# Patient Record
Sex: Male | Born: 1955 | Race: White | Hispanic: No | Marital: Married | Smoking: Former smoker
Health system: Southern US, Community
[De-identification: ages and names within clinical notes are randomized; demographics above are authoritative.]

## PROBLEM LIST (undated history)

## (undated) DIAGNOSIS — J449 Chronic obstructive pulmonary disease, unspecified: Secondary | ICD-10-CM

## (undated) HISTORY — PX: COLON SURGERY: SHX602

---

## 2016-12-26 ENCOUNTER — Emergency Department (HOSPITAL_COMMUNITY): Payer: Self-pay

## 2016-12-26 ENCOUNTER — Encounter (HOSPITAL_COMMUNITY): Payer: Self-pay | Admitting: Emergency Medicine

## 2016-12-26 ENCOUNTER — Emergency Department (HOSPITAL_COMMUNITY)
Admission: EM | Admit: 2016-12-26 | Discharge: 2016-12-26 | Disposition: A | Discharge status: Home / Self Care | Payer: Self-pay | Attending: Emergency Medicine | Admitting: Emergency Medicine

## 2016-12-26 DIAGNOSIS — R0602 Shortness of breath: Secondary | ICD-10-CM | POA: Insufficient documentation

## 2016-12-26 DIAGNOSIS — R52 Pain, unspecified: Secondary | ICD-10-CM

## 2016-12-26 DIAGNOSIS — J449 Chronic obstructive pulmonary disease, unspecified: Secondary | ICD-10-CM | POA: Insufficient documentation

## 2016-12-26 DIAGNOSIS — R1011 Right upper quadrant pain: Secondary | ICD-10-CM | POA: Insufficient documentation

## 2016-12-26 HISTORY — DX: Chronic obstructive pulmonary disease, unspecified: J44.9

## 2016-12-26 LAB — CBC
HCT: 52.2 % — ABNORMAL HIGH (ref 39.0–52.0)
HEMOGLOBIN: 18.4 g/dL — AB (ref 13.0–17.0)
MCH: 31.2 pg (ref 26.0–34.0)
MCHC: 35.2 g/dL (ref 30.0–36.0)
MCV: 88.5 fL (ref 78.0–100.0)
Platelets: 204 10*3/uL (ref 150–400)
RBC: 5.9 MIL/uL — AB (ref 4.22–5.81)
RDW: 12.4 % (ref 11.5–15.5)
WBC: 15 10*3/uL — ABNORMAL HIGH (ref 4.0–10.5)

## 2016-12-26 LAB — COMPREHENSIVE METABOLIC PANEL
ALK PHOS: 52 U/L (ref 38–126)
ALT: 28 U/L (ref 17–63)
ANION GAP: 15 (ref 5–15)
AST: 50 U/L — ABNORMAL HIGH (ref 15–41)
Albumin: 3.9 g/dL (ref 3.5–5.0)
BUN: 29 mg/dL — ABNORMAL HIGH (ref 6–20)
CHLORIDE: 95 mmol/L — AB (ref 101–111)
CO2: 27 mmol/L (ref 22–32)
CREATININE: 1.36 mg/dL — AB (ref 0.61–1.24)
Calcium: 9.3 mg/dL (ref 8.9–10.3)
GFR calc Af Amer: >60 mL/min (ref >60)
GFR calc non Af Amer: 55 mL/min — ABNORMAL LOW (ref >60)
GLUCOSE: 138 mg/dL — AB (ref 65–99)
Potassium: 3.6 mmol/L (ref 3.5–5.1)
Sodium: 137 mmol/L (ref 135–145)
Total Bilirubin: 0.3 mg/dL (ref 0.3–1.2)
Total Protein: 8.2 g/dL — ABNORMAL HIGH (ref 6.5–8.1)

## 2016-12-26 LAB — I-STAT TROPONIN, ED: Troponin i, poc: 0.01 ng/mL (ref 0.00–0.08)

## 2016-12-26 LAB — LIPASE, BLOOD: Lipase: 14 U/L (ref 11–51)

## 2016-12-26 MED ORDER — DOXYCYCLINE HYCLATE 100 MG PO CAPS: 100.0000 mg | ORAL_CAPSULE | Freq: Two times a day (BID) | ORAL | 0 refills | Status: DC

## 2016-12-26 MED ORDER — DOXYCYCLINE HYCLATE 100 MG IV SOLR
100.0000 mg | Freq: Once | INTRAVENOUS | Status: AC
  Administered 2016-12-26: 100 mg via INTRAVENOUS
  Filled 2016-12-26: qty 100

## 2016-12-26 MED ORDER — ONDANSETRON 4 MG PO TBDP
4.0000 mg | ORAL_TABLET | Freq: Once | ORAL | Status: AC
  Administered 2016-12-26: 4 mg via ORAL
  Filled 2016-12-26: qty 1

## 2016-12-26 MED ORDER — SODIUM CHLORIDE 0.9 % IV BOLUS (SEPSIS)
1000.0000 mL | Freq: Once | INTRAVENOUS | Status: AC
  Administered 2016-12-26: 1000 mL via INTRAVENOUS

## 2016-12-26 MED ORDER — ONDANSETRON 4 MG PO TBDP: 4.0000 mg | ORAL_TABLET | Freq: Three times a day (TID) | ORAL | 0 refills | Status: AC | PRN

## 2016-12-26 NOTE — ED Notes (Signed)
Pt verbalized understanding of d/c instructions and has no further questions. Pt is stable, A&Ox4, VSS.  

## 2016-12-26 NOTE — ED Notes (Signed)
Patient refused to wear a gown. He stated that he was too hot  CO of nausea, non witnessed No temp...98.2 when taken in room

## 2016-12-26 NOTE — ED Provider Notes (Signed)
[facility]EM[facility]ENCY DEPT Provider Note   CSN: [id number] Arrival date & time: 12/26/16  1449     History   Chief Complaint Chief Complaint  Patient presents with  . Generalized Body Aches  . Chills    HPI Raymond Robles is a 61 y.o. male with PMHx COPD who presents today with chief complaint 6 day history of "feeling sick" he states he mowed the lawn last Wednesday and noticed tick on his left shoulder on Thursday which his wife removed with a pair of tweezers. Wife states she was able to remove the entire tick with no issues, including the head. Since then pt states he has had a throbbing frontal HA that does not radiate, cough with intermittent white sputum production and SOB, generalized body aches and joint pains. He has also had watery non-bloody diarrhea since then with no formed stools. States abdominal pain is generalized crampy 7/10 pain which does not radiate. Denies chest pain. His wife states he was running a fever of 102F 4 days ago which has not returned and pt endorses feeling "hot and cold off and on". He denies rash. He has taken advil for his symptoms with no relief. Denies dizziness, LOC, dysuria, hematuria, melena, hematochezia. Of note, he states he had two tick bites on his abdomen 1 year ago which he removed and subsequently developed similar symptoms but did not seek medical attention at that time and his symptoms resolved. He has not had similar symptoms since. He is a 1/2ppd smoker for x 40 years.    The history is provided by the patient.    Past Medical History:  Diagnosis Date  . COPD (chronic obstructive pulmonary disease) (HCC)     There are no active problems to display for this patient.   No past surgical history on file.     Home Medications    Prior to Admission medications   Medication Sig Start Date End Date Taking? Authorizing Provider  doxycycline (VIBRAMYCIN) 100 MG capsule Take 1 capsule (100 mg total) by mouth 2 (two) times daily. 12/26/16  01/05/17  Faviola Klare A Erline Siddoway, PA-C  ondansetron (ZOFRAN ODT) 4 MG disintegrating tablet Take 1 tablet (4 mg total) by mouth every 8 (eight) hours as needed for nausea or vomiting. 12/26/16 12/29/16  Jeanie Sewer, PA-C    Family History No family history on file.  Social History Social History  Substance Use Topics  . Smoking status: Not on file  . Smokeless tobacco: Not on file  . Alcohol use Not on file     Allergies   Patient has no known allergies.   Review of Systems Review of Systems  Constitutional: Positive for chills, fatigue and fever.  Respiratory: Positive for cough and shortness of breath.   Cardiovascular: Negative for chest pain.  Gastrointestinal: Positive for abdominal pain, diarrhea, nausea and vomiting. Negative for blood in stool.  Genitourinary: Negative for dysuria and hematuria.  Musculoskeletal: Positive for arthralgias and myalgias. Negative for joint swelling.  Skin: Negative for rash.  Neurological: Positive for headaches. Negative for syncope, weakness and light-headedness.     Physical Exam Updated Vital Signs BP 124/72 (BP Location: Right Arm)   Pulse (!) 107   Temp 98.5 F (36.9 C) (Oral)   Resp 19   SpO2 91%   Physical Exam  Constitutional: He is oriented to person, place, and time.  Thin and chronically ill-appearing, resting in bed in NAD  HENT:  Head: Normocephalic and atraumatic.  Eyes: Conjunctivae and EOM are  normal. Right eye exhibits no discharge. Left eye exhibits no discharge. No scleral icterus.  Neck: Neck supple. No JVD present. No tracheal deviation present.  Cardiovascular: Normal rate, regular rhythm, normal heart sounds and intact distal pulses.   No murmur heard. Distant heart sounds, 2+ radial and dp/pt pulses BL,  Pulmonary/Chest: Effort normal and breath sounds normal. No respiratory distress.  Scattered rhonchi and wheezes, R>L. No accessory muscle use or chest wall tenderness.   Abdominal: Soft. Bowel sounds are  normal. He exhibits no distension. There is tenderness.  RUQ tenderness to palpation, positive Murphy's sign. No ttp of McBurney's point.  Musculoskeletal: Normal range of motion. He exhibits no edema.  5/5 strength BUE and BLE  Neurological: He is alert and oriented to person, place, and time.  No facial droop, fluent speech, gross sensation intact.  Skin: Skin is warm and dry. Capillary refill takes less than 2 seconds.  Small scab on left shoulder where tick was located. No surrounding erythema, rash, or drainage noted. No bleeding.  Psychiatric: He has a normal mood and affect. His behavior is normal.  Nursing note and vitals reviewed.    ED Treatments / Results  Labs (all labs ordered are listed, but only abnormal results are displayed) Labs Reviewed  COMPREHENSIVE METABOLIC PANEL - Abnormal; Notable for the following:       Result Value   Chloride 95 (*)    Glucose, Bld 138 (*)    BUN 29 (*)    Creatinine, Ser 1.36 (*)    Total Protein 8.2 (*)    AST 50 (*)    GFR calc non Af Amer 55 (*)    All other components within normal limits  CBC - Abnormal; Notable for the following:    WBC 15.0 (*)    RBC 5.90 (*)    Hemoglobin 18.4 (*)    HCT 52.2 (*)    All other components within normal limits  LIPASE, BLOOD  I-STAT TROPOININ, ED    EKG  EKG Interpretation  Date/Time:  Tuesday December 26 2016 22:40:01 EDT Ventricular Rate:  102 PR Interval:    QRS Duration: 88 QT Interval:  338 QTC Calculation: 441 R Axis:   1 Text Interpretation:  Sinus tachycardia Biatrial enlargement RSR' in V1 or V2, right VCD or RVH ST depr, consider ischemia, inferior leads No significant change since last tracing Confirmed by YAO  MD, DAVID ([id number]) on 12/26/2016 10:42:08 PM       Radiology Dg Chest 2 View  Result Date: 12/26/2016 CLINICAL DATA:  61 y/o  M; cough with concern for pneumonia. EXAM: CHEST  2 VIEW COMPARISON:  08/13/2012 chest radiograph FINDINGS: Stable normal cardiac  silhouette. Hyperinflated lungs with flattened diaphragms compatible with COPD. Ill-defined left lower lobe retrocardiac opacity. No pleural effusion or pneumothorax. No acute osseous abnormality IMPRESSION: Ill-defined left lower lobe retrocardiac opacity may represent pneumonia. These results were called by telephone at the time of interpretation on 12/26/2016 at 6:06 pm to Dr. Chaney Malling , who verbally acknowledged these results. Electronically Signed   By: Mitzi Hansen M.D.   On: 12/26/2016 18:07   US Abdomen Limited Ruq  Result Date: 12/26/2016 CLINICAL DATA:  Right upper quadrant pain. EXAM: US ABDOMEN LIMITED - RIGHT UPPER QUADRANT COMPARISON:  None. FINDINGS: Gallbladder: No gallstones or wall thickening visualized. No sonographic Murphy sign noted by sonographer. 2.9 mm nonshadowing echogenic focus along the wall likely a small polyp. Common bile duct: Diameter: 4.0 mm. Liver: No focal lesion  identified. Within normal limits in parenchymal echogenicity. IMPRESSION: No acute findings. Electronically Signed   By: Elberta Fortis M.D.   On: 12/26/2016 22:17    Procedures Procedures (including critical care time)  Medications Ordered in ED Medications  sodium chloride 0.9 % bolus 1,000 mL (0 mLs Intravenous Stopped 12/26/16 1940)  doxycycline (VIBRAMYCIN) 100 mg in dextrose 5 % 250 mL IVPB (0 mg Intravenous Stopped 12/26/16 1940)  ondansetron (ZOFRAN-ODT) disintegrating tablet 4 mg (4 mg Oral Given 12/26/16 1900)     Initial Impression / Assessment and Plan / ED Course  I have reviewed the triage vital signs and the nursing notes.  Pertinent labs & imaging results that were available during my care of the patient were reviewed by me and considered in my medical decision making (see chart for details).     60yom with generalized muscles aches, HA, cough and SOB, and abd pain with associated vomiting and diarrhea for 6 days after finding tick on left shoulder. Pt afebrile with stable  VS. CBC with leukocytosis 15k. CXR consistent with COPD and shows questionable possible LLL pneumonia. EKG shows minimal ST segment depression in inferior leads but negative troponin and history not concerning for ACS/MI. With RUQ ttp obtained US which showed no acute findings. Low suspicion cholecystitis, hepatitis, colitis, or appendicitis. Ambulatory pulse ox normal. Pt with possible PNE and known tick bite. Given doxycycline IV and NS while in ED as well as zofran for nausea. Will d/c with rx for doxycycline  bid x 10 days to treat for both, zofran for nausea, and recommend follow up with PCP for re-evaluation. Discussed strict ED return precautions. Pt verbalized understanding of and agreement with plan and is safe for discharge home at this time.  Final Clinical Impressions(s) / ED Diagnoses   Final diagnoses:  RUQ pain  Body aches  SOB (shortness of breath)    New Prescriptions Discharge Medication List as of 12/26/2016 10:49 PM    START taking these medications   Details  doxycycline (VIBRAMYCIN) 100 MG capsule Take 1 capsule (100 mg total) by mouth 2 (two) times daily., Starting Tue 12/26/2016, Until Fri 01/05/2017, Print    ondansetron (ZOFRAN ODT) 4 MG disintegrating tablet Take 1 tablet (4 mg total) by mouth every 8 (eight) hours as needed for nausea or vomiting., Starting Tue 12/26/2016, Until Fri 12/29/2016, Print         Jeanie Sewer, PA-C 12/27/16 0020    Charlynne Pander, MD 12/28/16 [phone]

## 2016-12-26 NOTE — ED Notes (Signed)
o2 began at 93% before ambulating during ambulation to restroom o2 dropped to 89% without oxygen.

## 2016-12-26 NOTE — ED Notes (Signed)
[place] called to let me know that she will get patient as soon as she can. Should be about an hour wait

## 2016-12-26 NOTE — ED Triage Notes (Addendum)
Pt arrives from home c/o generalized body aches and chils. Pt states he took a tick off of his left shoulder 2 days ago and has been feels "bad"  Every since.

## 2017-01-05 ENCOUNTER — Encounter: Payer: Self-pay | Admitting: Family Medicine

## 2017-01-05 ENCOUNTER — Ambulatory Visit (INDEPENDENT_AMBULATORY_CARE_PROVIDER_SITE_OTHER): Payer: Self-pay | Admitting: Family Medicine

## 2017-01-05 DIAGNOSIS — J439 Emphysema, unspecified: Secondary | ICD-10-CM

## 2017-01-05 DIAGNOSIS — J449 Chronic obstructive pulmonary disease, unspecified: Secondary | ICD-10-CM | POA: Insufficient documentation

## 2017-01-05 DIAGNOSIS — F17210 Nicotine dependence, cigarettes, uncomplicated: Secondary | ICD-10-CM | POA: Insufficient documentation

## 2017-01-05 DIAGNOSIS — F17200 Nicotine dependence, unspecified, uncomplicated: Secondary | ICD-10-CM

## 2017-01-05 DIAGNOSIS — W57XXXA Bitten or stung by nonvenomous insect and other nonvenomous arthropods, initial encounter: Secondary | ICD-10-CM

## 2017-01-05 DIAGNOSIS — K635 Polyp of colon: Secondary | ICD-10-CM

## 2017-01-05 MED ORDER — PREDNISONE 10 MG PO TABS: ORAL_TABLET | ORAL | 0 refills | Status: DC

## 2017-01-05 MED ORDER — AMOXICILLIN-POT CLAVULANATE 875-125 MG PO TABS: 1.0000 | ORAL_TABLET | Freq: Two times a day (BID) | ORAL | 0 refills | Status: DC

## 2017-01-05 MED ORDER — DOXYCYCLINE HYCLATE 100 MG PO CAPS: 100.0000 mg | ORAL_CAPSULE | Freq: Two times a day (BID) | ORAL | 0 refills | Status: DC

## 2017-01-05 NOTE — Progress Notes (Signed)
Subjective:  Patient ID: Raymond Robles, male    DOB: May 31, 1956  Age: 61 y.o. MRN: [id number]  CC: New Patient (Initial Visit) (pt here today to establish care and hospital follow up.)   HPI Zayvier Caravello presents for Bit by 2 ticks. Got pneumonia. Wants Lymes test. Smoker 40+ years. Most was at two packs a day. Now less than 1. Also exposed to asbestos in local school as a child. Says he can't even walk across the street any more due to shortness of breath. Feels he is disabled. Had colonoscopy recently. Insurance changed so he didn't go back for report. Was told he needed another procedure to get rid of a large polyp.Done at [facility]. Can't remember the doctor's name.    History Christos has a past medical history of COPD (chronic obstructive pulmonary disease) (HCC).   He has a past surgical history that includes Colon surgery.   His family history includes Arthritis in his mother; Asthma in his brother; COPD in his mother; Diabetes in his mother; Hypertension in his mother.He reports that he has been smoking Cigarettes.  He has a 44.00 pack-year smoking history. He has never used smokeless tobacco. He reports that he does not drink alcohol or use drugs.    ROS Review of Systems  Constitutional: Negative for chills, diaphoresis, fever and unexpected weight change.  HENT: Negative for congestion, hearing loss, rhinorrhea and sore throat.   Eyes: Negative for visual disturbance.  Respiratory: Negative for cough and shortness of breath.   Cardiovascular: Negative for chest pain.  Gastrointestinal: Negative for abdominal pain, constipation and diarrhea.  Genitourinary: Negative for dysuria and flank pain.  Musculoskeletal: Negative for arthralgias and joint swelling.  Skin: Negative for rash.  Neurological: Negative for dizziness and headaches.  Psychiatric/Behavioral: Negative for dysphoric mood and sleep disturbance.    Objective:  BP 119/66   Pulse 91   Temp 98.6 F (37 C) (Oral)    Ht 6' (1.829 m)   Wt 141 lb (64 kg)   BMI 19.12 kg/m   BP Readings from Last 3 Encounters:  01/05/17 119/66  12/26/16 124/72    Wt Readings from Last 3 Encounters:  01/05/17 141 lb (64 kg)      Physical Exam  Constitutional: He is oriented to person, place, and time. He appears well-developed and well-nourished. No distress.  HENT:  Head: Normocephalic and atraumatic.  Right Ear: External ear normal.  Left Ear: External ear normal.  Nose: Nose normal.  Mouth/Throat: Oropharynx is clear and moist.  Eyes: Conjunctivae and EOM are normal. Pupils are equal, round, and reactive to light.  Neck: Normal range of motion. Neck supple. No thyromegaly present.  Cardiovascular: Normal rate, regular rhythm and normal heart sounds.   No murmur heard. Pulmonary/Chest: Effort normal. No respiratory distress. He has wheezes (all fields).  Abdominal: Soft. Bowel sounds are normal. He exhibits no distension. There is no tenderness.  Lymphadenopathy:    He has no cervical adenopathy.  Neurological: He is alert and oriented to person, place, and time. He has normal reflexes.  Skin: Skin is warm and dry.  Psychiatric: His affect is angry and inappropriate. His speech is rapid and/or pressured. He is agitated and aggressive. Thought content is paranoid. Cognition and memory are normal. He expresses impulsivity and inappropriate judgment.      Assessment & Plan:   Soma was seen today for new patient (initial visit).  Diagnoses and all orders for this visit:  Pulmonary emphysema, unspecified emphysema type (HCC) -  Ambulatory referral to Pulmonology  Tobacco dependence  Tick bite, initial encounter -     Lyme Ab/Western Blot Reflex  Polyp of colon, unspecified part of colon, unspecified type  Other orders -     amoxicillin-clavulanate (AUGMENTIN) 875-125 MG tablet; Take 1 tablet by mouth 2 (two) times daily. Take all of this medication -     doxycycline (VIBRAMYCIN) 100 MG capsule;  Take 1 capsule (100 mg total) by mouth 2 (two) times daily. -     predniSONE (DELTASONE) 10 MG tablet; Take 5 daily for 3 days followed by 4,3,2 and 1 for 3 days each.       I have discontinued Mr. Fedewa doxycycline. I am also having him start on amoxicillin-clavulanate, doxycycline, and predniSONE.  Allergies as of 01/05/2017   No Known Allergies     Medication List       Accurate as of 01/05/17  3:29 PM. Always use your most recent med list.          amoxicillin-clavulanate 875-125 MG tablet Commonly known as:  AUGMENTIN Take 1 tablet by mouth 2 (two) times daily. Take all of this medication   doxycycline 100 MG capsule Commonly known as:  VIBRAMYCIN Take 1 capsule (100 mg total) by mouth 2 (two) times daily.   predniSONE 10 MG tablet Commonly known as:  DELTASONE Take 5 daily for 3 days followed by 4,3,2 and 1 for 3 days each.      Pt. Declines CXR. States he had one last year.  Pt. Went into an extended rant regarding previous tests being run and No one being able to help him. 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 They just want to take his money.   Follow-up: Return if symptoms worsen or fail to improve.  Mechele Claude, M.D.

## 2017-01-09 LAB — LYME AB/WESTERN BLOT REFLEX

## 2018-12-06 ENCOUNTER — Telehealth: Payer: Self-pay | Admitting: Family Medicine

## 2018-12-06 NOTE — Telephone Encounter (Signed)
Spoke with pt's wife regarding testing No testing available, at this time, unless hospitalized Verbalizes understanding Will remain in quarantine until 14 days completed

## 2018-12-06 NOTE — Telephone Encounter (Signed)
PT was exposed to someone that tested positive for COVID-19 10 days ago, no symptoms but was told he had to be tested before coming back to work, pt went to hospital and they would not test him because of not showing symptoms pt wife is wanting to know what he needs to do.

## 2019-01-15 ENCOUNTER — Telehealth: Payer: Self-pay | Admitting: Family Medicine

## 2019-01-15 NOTE — Telephone Encounter (Signed)
LM for pt to call back and get signed up for mychart

## 2020-01-15 ENCOUNTER — Ambulatory Visit: Payer: Self-pay | Attending: Internal Medicine

## 2020-01-15 DIAGNOSIS — Z23 Encounter for immunization: Secondary | ICD-10-CM

## 2020-01-15 NOTE — Progress Notes (Signed)
   Covid-19 Vaccination Clinic  Name:  Raymond Robles    MRN: [id number] DOB: 03-03-1956  01/15/2020  Raymond Robles was observed post Covid-19 immunization for 15 minutes without incident. He was provided with Vaccine Information Sheet and instruction to access the V-Safe system.   Raymond Robles was instructed to call 911 with any severe reactions post vaccine: Marland Kitchen Difficulty breathing  . Swelling of face and throat  . A fast heartbeat  . A bad rash all over body  . Dizziness and weakness   Immunizations Administered    Name Date Dose VIS Date Route   Moderna COVID-19 Vaccine 01/15/2020 10:33 AM 0.5 mL 08/2019 Intramuscular   Manufacturer: Moderna   Lot: [id number]   NDC: 61470-929-57

## 2020-02-12 ENCOUNTER — Ambulatory Visit: Payer: Self-pay

## 2021-06-02 ENCOUNTER — Encounter: Payer: Self-pay | Admitting: Internal Medicine

## 2021-06-02 ENCOUNTER — Ambulatory Visit (HOSPITAL_COMMUNITY)
Admission: RE | Admit: 2021-06-02 | Discharge: 2021-06-02 | Disposition: A | Discharge status: Home / Self Care | Payer: No Typology Code available for payment source | Source: Ambulatory Visit | Attending: Internal Medicine | Admitting: Internal Medicine

## 2021-06-02 ENCOUNTER — Ambulatory Visit (INDEPENDENT_AMBULATORY_CARE_PROVIDER_SITE_OTHER): Payer: No Typology Code available for payment source | Admitting: Internal Medicine

## 2021-06-02 ENCOUNTER — Other Ambulatory Visit: Payer: Self-pay

## 2021-06-02 DIAGNOSIS — F17210 Nicotine dependence, cigarettes, uncomplicated: Secondary | ICD-10-CM

## 2021-06-02 DIAGNOSIS — J449 Chronic obstructive pulmonary disease, unspecified: Secondary | ICD-10-CM

## 2021-06-02 MED ORDER — AMOXICILLIN-POT CLAVULANATE 875-125 MG PO TABS: 1.0000 | ORAL_TABLET | Freq: Two times a day (BID) | ORAL | 0 refills | Status: AC

## 2021-06-02 MED ORDER — PREDNISONE 10 MG PO TABS: ORAL_TABLET | ORAL | 0 refills | Status: DC

## 2021-06-02 MED ORDER — ALBUTEROL SULFATE (2.5 MG/3ML) 0.083% IN NEBU: 2.5000 mg | INHALATION_SOLUTION | RESPIRATORY_TRACT | 12 refills | Status: AC | PRN

## 2021-06-02 MED ORDER — BREZTRI AEROSPHERE 160-9-4.8 MCG/ACT IN AERO: INHALATION_SPRAY | RESPIRATORY_TRACT | 11 refills | Status: DC

## 2021-06-02 MED ORDER — BREZTRI AEROSPHERE 160-9-4.8 MCG/ACT IN AERO: 2.0000 | INHALATION_SPRAY | Freq: Two times a day (BID) | RESPIRATORY_TRACT | 0 refills | Status: DC

## 2021-06-02 NOTE — Patient Instructions (Addendum)
Augmentin 875 mg take one pill twice daily  X 10 days - take at breakfast and supper with large glass of water.  It would help reduce the usual side effects (diarrhea and yeast infections) if you ate cultured yogurt at lunch.   Prednisone 10 mg take  4 each am x 2 days,   2 each am x 2 days,  1 each am x 2 days and stop   Plan A = Automatic = Always=    breztri Take 2 puffs first thing in am and then another 2 puffs about 12 hours later.     Plan B = Backup (to supplement plan A, not to replace it) Only use your albuterol inhaler as a rescue medication to be used if you can't catch your breath by resting or doing a relaxed purse lip breathing pattern.  - The less you use it, the better it will work when you need it. - Ok to use the inhaler up to 2 puffs  every 4 hours if you must but call for appointment if use goes up over your usual need - Don't leave home without it !!  (think of it like the spare tire for your car)   Plan C = Crisis (instead of Plan B but only if Plan B stops working) - only use your albuterol nebulizer (8 x stronger than plan A) if you first try Plan B and it fails to help > ok to use the nebulizer up to every 4 hours but if start needing it regularly call for immediate appointment  [facility] - if they don't cover these medications get them to give you a list of what respiratory medications they do cover  The key is to stop smoking completely before smoking completely stops you!        Please schedule a follow up office visit in 4 weeks, sooner if needed

## 2021-06-02 NOTE — Progress Notes (Signed)
Raymond Robles, male    DOB: 1956/05/06,    MRN: [id number]   Brief patient profile:  65 yowm  active smoker referred to pulmonary clinic in [place]  06/02/2021 by [facility] for copd   Baseline 2019 was not really limited and gradually worse doe since then    History of Present Illness  06/02/2021  Pulmonary/ 1st office eval/ [name] /  Office maint on spiriva Chief Complaint  Patient presents with   Consult    [facility] referral for symptoms beginning last year but worsening in April/May 2022. SOB worsened in may 2022. Coughing up yellow mucus consistently. Notices SOB when laying down at night time accompanied by wheezing and coughing if lying on his back.  Has a nebulizer at home but has not used it. Current smoker.    Dyspnea:  mailbox to house  Cough: worse x 1 year eso first thing x 45 min also hs Sleep: one pillow SABA use: never uses alb noct   No other obvious day to day or daytime variability or assoc  mucus plugs or hemoptysis or cp or chest tightness, subjective wheeze or overt sinus or hb symptoms.    Also denies any obvious fluctuation of symptoms with weather or environmental changes or other aggravating or alleviating factors except as outlined above   No unusual exposure hx or h/o childhood pna/ asthma or knowledge of premature birth.  Current Allergies, Complete Past Medical History, Past Surgical History, Family History, and Social History were reviewed in [employer] record.  ROS  The following are not active complaints unless bolded Hoarseness, sore throat, dysphagia, dental problems, itching, sneezing,  nasal congestion or discharge of excess mucus or purulent secretions, ear ache,   fever, chills, sweats, unintended wt loss or wt gain, classically pleuritic or exertional cp,  orthopnea pnd or arm/hand swelling  or leg swelling, presyncope, palpitations, abdominal pain, anorexia, nausea, vomiting, diarrhea  or change in bowel habits or change in  bladder habits, change in stools or change in urine, dysuria, hematuria,  rash, arthralgias, visual complaints, headache, numbness, weakness or ataxia or problems with walking or coordination,  change in mood or  memory.           Past Medical History:  Diagnosis Date   COPD (chronic obstructive pulmonary disease) (HCC)     Outpatient Medications Prior to Visit  Medication Sig Dispense Refill   ALBUTEROL IN Inhale into the lungs.     tiotropium (SPIRIVA) 18 MCG inhalation capsule Place into inhaler and inhale.                              Objective:     BP 128/72 (BP Location: Left Arm, Patient Position: Sitting)   Pulse 64   Temp 97.7 F (36.5 C) (Temporal)   Ht 5' 10.5" (1.791 m)   Wt 145 lb 1.9 oz (65.8 kg)   SpO2 97% Comment: ra  BMI 20.53 kg/m   SpO2: 97 % (ra)  HEENT : pt wearing mask not removed for exam due to covid -19 concerns.    NECK :  without JVD/Nodes/TM/ nl carotid upstrokes bilaterally   LUNGS: no acc muscle use,  Mod barrel  contour chest wall with bilateral  pan exp wheeze and  without cough on insp or exp maneuvers and mod  Hyperresonant  to  percussion bilaterally     CV:  RRR  no s3 or murmur or increase  in P2, and no edema   ABD:  soft and nontender with pos mid insp Hoover's  in the supine position. No bruits or organomegaly appreciated, bowel sounds nl  MS:     ext warm without deformities, calf tenderness, cyanosis or clubbing No obvious joint restrictions   SKIN: warm and dry without lesions    NEURO:  alert, approp, nl sensorium with  no motor or cerebellar deficits apparent.       Labs ordered 06/02/2021  :    alpha one AT phenotype    CXR PA and Lateral:   06/02/2021 :    I personally reviewed images / impression as follows:    Mod copd     Assessment   No problem-specific Assessment & Plan notes found for this encounter.     Sandrea Hughs, MD 06/02/2021

## 2021-06-03 ENCOUNTER — Encounter: Payer: Self-pay | Admitting: Internal Medicine

## 2021-06-03 NOTE — Assessment & Plan Note (Addendum)
Active smoker/ pfts at [facility] per pt - 06/02/2021  After extensive coaching inhaler device,  effectiveness =    75% (short Ti) > Breztri trial  - 06/02/2021   Walked on RA x  3  lap(s) =  approx 450 @ slow to mod  pace, stopped due to end of study with sob on last lap with lowest 02 sats 94%  Labs ordered 06/02/2021  :  alpha one AT phenotype      Group D in terms of symptom/risk and laba/lama/ICS  therefore appropriate rx at this point >>>  breztri best bet for now if [facility] covers       Has active AB on initial eval so rec  1) Augmentin 875 mg take one pill twice daily  X 10 days - take at breakfast and supper with large glass of water.  It would help reduce the usual side effects (diarrhea and yeast infections) if you ate cultured yogurt at lunch.  2) Prednisone 10 mg take  4 each am x 2 days,   2 each am x 2 days,  1 each am x 2 days and stop 3) breztri x 1 month  Advised:  formulary restrictions will be an ongoing challenge for the forseable future and I would be happy to pick an alternative if the pt will first  provide me a list of them -  pt  will need to return here for training for any new device that is required eg dpi vs hfa vs respimat.    In the meantime we can always provide samples so that the patient never runs out of any needed respiratory medications.

## 2021-06-03 NOTE — Assessment & Plan Note (Signed)
Counseled re importance of smoking cessation but did not meet time criteria for separate billing           Each maintenance medication was reviewed in detail including emphasizing most importantly the difference between maintenance and prns and under what circumstances the prns are to be triggered using an action plan format where appropriate.  Total time for H and P, chart review, counseling, reviewing hfa device(s) and generating customized AVS unique to this initial office visit / same day charting = 45 min       

## 2021-06-06 LAB — CBC WITH DIFFERENTIAL/PLATELET
Basophils Absolute: 0.1 10*3/uL (ref 0.0–0.2)
Basos: 1 %
EOS (ABSOLUTE): 0.3 10*3/uL (ref 0.0–0.4)
Eos: 3 %
Hematocrit: 48.5 % (ref 37.5–51.0)
Hemoglobin: 16.4 g/dL (ref 13.0–17.7)
Immature Grans (Abs): 0 10*3/uL (ref 0.0–0.1)
Immature Granulocytes: 0 %
Lymphocytes Absolute: 2.2 10*3/uL (ref 0.7–3.1)
Lymphs: 27 %
MCH: 29.8 pg (ref 26.6–33.0)
MCHC: 33.8 g/dL (ref 31.5–35.7)
MCV: 88 fL (ref 79–97)
Monocytes Absolute: 0.7 10*3/uL (ref 0.1–0.9)
Monocytes: 9 %
Neutrophils Absolute: 5 10*3/uL (ref 1.4–7.0)
Neutrophils: 60 %
Platelets: 282 10*3/uL (ref 150–450)
RBC: 5.5 x10E6/uL (ref 4.14–5.80)
RDW: 11.7 % (ref 11.6–15.4)
WBC: 8.3 10*3/uL (ref 3.4–10.8)

## 2021-06-06 LAB — ALPHA-1-ANTITRYPSIN PHENOTYP: A-1 Antitrypsin: 152 mg/dL (ref 101–187)

## 2021-06-13 ENCOUNTER — Telehealth: Payer: Self-pay | Admitting: Internal Medicine

## 2021-06-13 ENCOUNTER — Other Ambulatory Visit: Payer: Self-pay

## 2021-06-13 MED ORDER — BREZTRI AEROSPHERE 160-9-4.8 MCG/ACT IN AERO: INHALATION_SPRAY | RESPIRATORY_TRACT | 11 refills | Status: DC

## 2021-06-13 NOTE — Telephone Encounter (Signed)
Raymond Robles called back and stated the prescription for Raymond Robles needs to be faxed to [facility].  Fax:  [phone].  [facility] not be [facility]ing formulary for [facility].  Just needs prescription faxed to the pharmacy.  Thought it was sent to PCP.  Please advise.  PH:  [phone]

## 2021-06-13 NOTE — Telephone Encounter (Signed)
[facility] to [facility]

## 2021-06-14 ENCOUNTER — Telehealth: Payer: Self-pay | Admitting: Internal Medicine

## 2021-06-14 NOTE — Telephone Encounter (Signed)
Request received from [place] about changing from breztri to wixela and spiriva.   Dr. [name] please advise if this okay?  Thanks!    *Adding in [place] Info for info to fax*   Dept. Of [facility]  [address]  [place], [place] [address]  For new prescription: Fax:  [phone] Or electronically send to [facility] pharmacy

## 2021-06-14 NOTE — Telephone Encounter (Signed)
That is fine but totally different devices they will have to teach him and he'll need to return here asap p starting these to be sure they are adequate and bring the devices with him to confirm he knows how to use them

## 2021-06-15 MED ORDER — SPIRIVA RESPIMAT 2.5 MCG/ACT IN AERS: 2.0000 | INHALATION_SPRAY | Freq: Every day | RESPIRATORY_TRACT | 6 refills | Status: DC

## 2021-06-15 MED ORDER — FLUTICASONE-SALMETEROL 250-50 MCG/ACT IN AEPB: 1.0000 | INHALATION_SPRAY | Freq: Two times a day (BID) | RESPIRATORY_TRACT | 6 refills | Status: DC

## 2021-06-15 NOTE — Telephone Encounter (Signed)
MW, which strength would you like for the Wixela and Spiriva?

## 2021-06-15 NOTE — Telephone Encounter (Signed)
LMTCB for the pt- need to be sure okay with him and he is aware before sending and make sure he knows to have pharm show him how to use new inhalers. I am forwarding this back to [place] since he is [place] pt. Thanks.

## 2021-06-15 NOTE — Telephone Encounter (Signed)
Wixella is 250 one bid   and spiriva is 2.5 mg 2 each am

## 2021-06-15 NOTE — Telephone Encounter (Signed)
Spoke to patient. He agreed to try [facility] and Spiriva and agreed to have pharmacy team show him how to use it when he picks up RX. Nothing further needed at this time.

## 2021-08-02 ENCOUNTER — Encounter: Payer: Self-pay | Admitting: Internal Medicine

## 2021-08-02 ENCOUNTER — Ambulatory Visit (INDEPENDENT_AMBULATORY_CARE_PROVIDER_SITE_OTHER): Payer: No Typology Code available for payment source | Admitting: Internal Medicine

## 2021-08-02 ENCOUNTER — Other Ambulatory Visit: Payer: Self-pay

## 2021-08-02 VITALS — BP 138/72 | HR 92 | Temp 98.0°F | Ht 70.0 in | Wt 148.0 lb

## 2021-08-02 DIAGNOSIS — J449 Chronic obstructive pulmonary disease, unspecified: Secondary | ICD-10-CM

## 2021-08-02 DIAGNOSIS — F17210 Nicotine dependence, cigarettes, uncomplicated: Secondary | ICD-10-CM

## 2021-08-02 NOTE — Patient Instructions (Addendum)
Plan A = Automatic = Always=    Breztri Take 2 puffs first thing in am and then another 2 puffs about 12 hours later.   OR    [facility] equivalent  = spiriva 2 puffs first thing in am and wixilala one twice daily   Work on inhaler technique:  relax and gently blow all the way out then take a nice smooth full deep breath back in, triggering the inhaler at same time you start breathing in.  Hold for up to 5 seconds if you can. Blow breztri and wixela  out thru nose. Rinse and gargle with water when done.  If mouth or throat bother you at all,  try brushing teeth/gums/tongue with arm and hammer toothpaste/ make a slurry and gargle and spit out.       Plan B = Backup (to supplement plan A, not to replace it) Only use your albuterol inhaler as a rescue medication to be used if you can't catch your breath by resting or doing a relaxed purse lip breathing pattern.  - The less you use it, the better it will work when you need it. - Ok to use the inhaler up to 2 puffs  every 4 hours if you must but call for appointment if use goes up over your usual need - Don't leave home without it !!  (think of it like the spare tire for your car)   Plan C = Crisis (instead of Plan B but only if Plan B stops working) - only use your albuterol nebulizer if you first try Plan B and it fails to help > ok to use the nebulizer up to every 4 hours but if start needing it regularly call for immediate appointment     Please schedule a follow up visit in 3 months but call sooner if needed with pfts on return

## 2021-08-02 NOTE — Progress Notes (Signed)
Raymond Robles, male    DOB: 19-Jun-1956,    MRN: [id number]   Brief patient profile:  65 yowm  active smoker referred to pulmonary clinic in [place]  06/02/2021 by [facility] for copd   Baseline 2019 was not really limited and gradually worse doe since then    History of Present Illness  06/02/2021  Pulmonary/ 1st office eval/ [name] / [place] Office maint on spiriva Chief Complaint  Patient presents with   Consult    [facility] referral for [facility] beginning last year but worsening in April/May 2022. SOB worsened in may 2022. Coughing up yellow mucus consistently. Notices SOB when laying down at night time accompanied by wheezing and coughing if lying on his back.  Has a nebulizer at home but has not used it. Current smoker.    Dyspnea:  mailbox to house  Cough: worse x 1 year eso first thing x 45 min also hs Sleep: one pillow SABA use: never uses alb noct  Rec Augmentin 875 mg take one pill twice daily  X 10 days   Prednisone 10 mg take  4 each am x 2 days,   2 each am x 2 days,  1 each am x 2 days and stop  Plan A = Automatic = Always=    breztri Take 2 puffs first thing in am and then another 2 puffs about 12 hours later.   Plan B = Backup (to supplement plan A, not to replace it) Only use your albuterol inhaler as a rescue medication Plan C = Crisis (instead of Plan B but only if Plan B stops working) - only use your albuterol nebulizer (8 x stronger than plan A) if you first try Plan B and it fails to help > ok to use the nebulizer up to every 4 hours but if start needing it regularly call for immediate appointment [facility] with [facility] - if they don't cover these medications get them to give you a list of what respiratory medications they do cover The key is to stop smoking completely before smoking completely stops you!    08/02/2021  f/u ov/[place] office/Sorayah Schrodt re: copd GOLD ? / still smoking maint on breztri with about 75% effective  technique/ improved since last ov Chief Complaint  Patient  presents with   Follow-up    Cough, wheezing and SOB have improved since last OV.   Dyspnea:  mb and back to house better  Cough: minimal in am mucoid  Sleeping: ok  flat  SABA use: not using now  02: none  Covid status: vax x 2  [facility]21    No obvious day to day or daytime variability or assoc excess/ purulent sputum or mucus plugs or hemoptysis or cp or chest tightness, subjective wheeze or overt sinus or hb symptoms.   Sleeping  without nocturnal    exacerbation  of respiratory  c/o's or need for noct saba. Also denies any obvious fluctuation of symptoms with weather or environmental changes or other aggravating or alleviating factors except as outlined above   No unusual exposure hx or h/o childhood pna/ asthma or knowledge of premature birth.  Current Allergies, Complete Past Medical History, Past Surgical History, Family History, and Social History were reviewed in [employer] record.  ROS  The following are not active complaints unless bolded Hoarseness, sore throat, dysphagia, dental problems, itching, sneezing,  nasal congestion or discharge of excess mucus or purulent secretions, ear  ache,   fever, chills, sweats, unintended wt loss or wt gain, classically pleuritic or exertional cp,  orthopnea pnd or arm/hand swelling  or leg swelling, presyncope, palpitations, abdominal pain, anorexia, nausea, vomiting, diarrhea  or change in bowel habits or change in bladder habits, change in stools or change in urine, dysuria, hematuria,  rash, arthralgias, visual complaints, headache, numbness, weakness or ataxia or problems with walking or coordination,  change in mood or  memory.        Current Meds  Medication Sig   albuterol (PROVENTIL) (2.5 MG/3ML) 0.083% nebulizer solution Take 3 mLs (2.5 mg total) by nebulization every 4 (four) hours as needed for wheezing or shortness of breath.   ALBUTEROL IN Inhale into  the lungs.   Budeson-Glycopyrrol-Formoterol (BREZTRI AEROSPHERE) 160-9-4.8 MCG/ACT AERO Take 2 puffs first thing in am and then another 2 puffs about 12 hours later.               Past Medical History:  Diagnosis Date   COPD (chronic obstructive pulmonary disease) (HCC)        Objective:    Wt Readings from Last 3 Encounters:  08/02/21 148 lb 0.6 oz (67.2 kg)  06/02/21 145 lb 1.9 oz (65.8 kg)  01/05/17 141 lb (64 kg)      Vital signs reviewed  08/02/2021  - Note at rest 02 sats  97% on RA   General appearance:    elderly wm > stated age    89 : pt wearing mask not removed for exam due to covid -19 concerns.    NECK :  without JVD/Nodes/TM/ nl carotid upstrokes bilaterally   LUNGS: no acc muscle use,  Mod barrel  contour chest wall with bilateral  Distant bs s audible wheeze and  without cough on insp or exp maneuvers and mod  Hyperresonant  to  percussion bilaterally     CV:  RRR  no s3 or murmur or increase in P2, and no edema   ABD:  soft and nontender with pos mid insp Hoover's  in the supine position. No bruits or organomegaly appreciated, bowel sounds nl  MS:     ext warm without deformities, calf tenderness, cyanosis or clubbing No obvious joint restrictions   SKIN: warm and dry without lesions    NEURO:  alert, approp, nl sensorium with  no motor or cerebellar deficits apparent.       LDSCT  07/07/2021 [facility] all benign appearing rec  [facility]     Assessment

## 2021-08-03 ENCOUNTER — Encounter: Payer: Self-pay | Admitting: Internal Medicine

## 2021-08-03 NOTE — Assessment & Plan Note (Signed)
Counseled re importance of smoking cessation but did not meet time criteria for separate billing    Each maintenance medication was reviewed in detail including emphasizing most importantly the difference between maintenance and prns and under what circumstances the prns are to be triggered using an action plan format where appropriate.  Total time for H and P, chart review, counseling, reviewing smi/dpi/hfa device(s) , directly observing portions of ambulatory 02 saturation study/ and generating customized AVS unique to this office visit / same day charting = 35 min

## 2021-08-03 NOTE — Assessment & Plan Note (Addendum)
Active smoker/ pfts at [facility] per pt - 06/02/2021  After extensive coaching inhaler device,  effectiveness =    75% (short Ti) > Breztri trial  - 06/02/2021   Walked on RA x  3  lap(s) =  approx 450 @ slow to mod  pace, stopped due to end of study with sob on last lap with lowest 02 sats 94%  Labs ordered 06/02/2021  :  alpha one AT phenotype  MS  Level 152 / Eos 0.3  - 08/02/2021   Walked on RA x  3  lap(s) =  approx 450 @ moderately fast pace, stopped due to end of study, sob p 1st lap with lowest 02 sats 95%    Group D in terms of symptom/risk and laba/lama/ICS  therefore appropriate rx at this point >>>  Continue breztri vs spiriva smi/wixella  - The proper method of use, as well as anticipated side effects, of a metered-dose inhaler/ dpi and smi  were all discussed and demonstrated to the patient using teach back method.   No change rx needed other than stop smoking now (see separate a/p)   Return in 6 m for pfts

## 2021-10-26 ENCOUNTER — Encounter: Payer: Self-pay | Admitting: Internal Medicine

## 2021-10-26 ENCOUNTER — Ambulatory Visit (INDEPENDENT_AMBULATORY_CARE_PROVIDER_SITE_OTHER): Payer: No Typology Code available for payment source | Admitting: Internal Medicine

## 2021-10-26 ENCOUNTER — Other Ambulatory Visit: Payer: Self-pay

## 2021-10-26 DIAGNOSIS — F17210 Nicotine dependence, cigarettes, uncomplicated: Secondary | ICD-10-CM | POA: Diagnosis not present

## 2021-10-26 DIAGNOSIS — J449 Chronic obstructive pulmonary disease, unspecified: Secondary | ICD-10-CM

## 2021-10-26 NOTE — Assessment & Plan Note (Signed)
4-5 min discussion re active cigarette smoking in addition to office E&M  Ask about tobacco use:   Ongoing  Advise quitting   I took an extended  opportunity with this patient to outline the consequences of continued cigarette use  in airway disorders based on all the data we have from the multiple national lung health studies (perfomed over decades at millions of dollars in cost)  indicating that smoking cessation, not choice of inhalers or physicians, is the most important aspect of his care and once he stops smoking he can expect stabilization of his airways and reduction in am cough as concrete evidence that he made the righ choice Assist in quit attempt:  Per PCP when ready Arrange follow up:   Follow up per Primary Care planned

## 2021-10-26 NOTE — Progress Notes (Signed)
Neta Mends, male    DOB: [birth year],    MRN: [id number]   Brief patient profile:  12 yowm  active smoker referred to pulmonary clinic in [place]  06/02/2021 by [facility] for copd   Baseline 2019 was not really limited and gradually worse doe since then    History of Present Illness  06/02/2021  Pulmonary/ 1st office eval/ Melvyn Novas / [place] Office maint on spiriva Chief Complaint  Patient presents with   Consult    [facility] referral for symptoms beginning last year but worsening in April/May 2022. SOB worsened in may 2022. Coughing up yellow mucus consistently. Notices SOB when laying down at night time accompanied by wheezing and coughing if lying on his back.  Has a nebulizer at home but has not used it. Current smoker.    Dyspnea:  mailbox to house  Cough: worse x 1 year eso first thing x 45 min also hs Sleep: one pillow SABA use: never uses alb noct  Rec Augmentin 875 mg take one pill twice daily  X 10 days   Prednisone 10 mg take  4 each am x 2 days,   2 each am x 2 days,  1 each am x 2 days and stop  Plan A = Automatic = Always=    breztri Take 2 puffs first thing in am and then another 2 puffs about 12 hours later.   Plan B = Backup (to supplement plan A, not to replace it) Only use your albuterol inhaler as a rescue medication Plan C = Crisis (instead of Plan B but only if Plan B stops working) - only use your albuterol nebulizer (8 x stronger than plan A) if you first try Plan B and it fails to help > ok to use the nebulizer up to every 4 hours but if start needing it regularly call for immediate appointment Check with [facility] - if they don't cover these medications get them to give you a list of what respiratory medications they do cover The key is to stop smoking completely before smoking completely stops you!    08/02/2021  f/u ov/[place] office/Kasarah Sitts re: copd GOLD ? / still smoking maint on breztri with about 75% effective  technique/ improved since last ov Chief Complaint  Patient  presents with   Follow-up    Cough, wheezing and SOB have improved since last OV.   Dyspnea:  mb and back to house better  Cough: minimal in am mucoid  Sleeping: ok  flat  SABA use: not using now  02: none  Covid status: vax x 2  [facility]21  Rec Plan A = Automatic = Always=    Breztri Take 2 puffs first thing in am and then another 2 puffs about 12 hours later.  OR    [facility] equivalent  = spiriva 2 puffs first thing in am and wixilala one twice daily  Work on inhaler technique:   Plan B = Backup (to supplement plan A, not to replace it) Only use your albuterol inhaler as a rescue medication  Plan C = Crisis (instead of Plan B but only if Plan B stops working) - only use your albuterol nebulizer if you first try Plan B and it fails to help  Please schedule a follow up visit in 3 months but call sooner if needed with pfts on return        10/26/2021  f/u ov/[place] office/Johnmichael Melhorn re: GOLD ?  maint  on breztri 2bid   Chief Complaint  Patient presents with   Follow-up    Breathing wheezing and coughing have improved.   Dyspnea:  mb and back is easier  Cough: some in am / yellowish x 2 coughs and resolves  Sleeping: flat bed one pillow  SABA use: misunderstanding  02: none  Covid status: vax x 3  [facility] cancer [facility]: [facility]   No obvious day to day or daytime variability or assoc excess/ purulent sputum or mucus plugs or hemoptysis or cp or chest tightness, subjective wheeze or overt sinus or hb symptoms.   Sleeping  without nocturnal   exacerbation  of respiratory  c/o's or need for noct saba. Also denies any obvious fluctuation of symptoms with weather or environmental changes or other aggravating or alleviating factors except as outlined above   No unusual exposure hx or h/o childhood pna/ asthma or knowledge of premature birth.  Current Allergies, Complete Past Medical History, Past Surgical History, Family History, and Social  History were reviewed in [employer] record.  ROS  The following are not active complaints unless bolded Hoarseness, sore throat, dysphagia, dental problems, itching, sneezing,  nasal congestion or discharge of excess mucus or purulent secretions, ear ache,   fever, chills, sweats, unintended wt loss or wt gain, classically pleuritic or exertional cp,  orthopnea pnd or arm/hand swelling  or leg swelling, presyncope, palpitations, abdominal pain, anorexia, nausea, vomiting, diarrhea  or change in bowel habits or change in bladder habits, change in stools or change in urine, dysuria, hematuria,  rash, arthralgias, visual complaints, headache, numbness, weakness or ataxia or problems with walking or coordination,  change in mood or  memory.        Current Meds  Medication Sig   albuterol (PROVENTIL) (2.5 MG/3ML) 0.083% nebulizer solution Take 3 mLs (2.5 mg total) by nebulization every 4 (four) hours as needed for wheezing or shortness of breath.   ALBUTEROL IN Inhale into the lungs.   Budeson-Glycopyrrol-Formoterol (BREZTRI AEROSPHERE) 160-9-4.8 MCG/ACT AERO Take 2 puffs first thing in am and then another 2 puffs about 12 hours later.                    Past Medical History:  Diagnosis Date   COPD (chronic obstructive pulmonary disease) (HCC)        Objective:    10/26/2021       147   08/02/21 148 lb 0.6 oz (67.2 kg)  06/02/21 145 lb 1.9 oz (65.8 kg)  01/05/17 141 lb (64 kg)    Vital signs reviewed  10/26/2021  - Note at rest 02 sats  96% on RA   General appearance:    amb wm nad but easily confused with details of care   HEENT : pt wearing mask not removed for exam due to covid -19 concerns.    NECK :  without JVD/Nodes/TM/ nl carotid upstrokes bilaterally   LUNGS: no acc muscle use,  Mod barrel  contour chest wall with bilateral insp /esp rhonchi and  without cough on insp or exp maneuvers and mod  Hyperresonant  to  percussion bilaterally     CV:  RRR   no s3 or murmur or increase in P2, and no edema   ABD:  soft and nontender with pos mid insp Hoover's  in the supine position. No bruits or organomegaly appreciated, bowel sounds nl  MS:     ext warm without deformities, calf tenderness, cyanosis or  clubbing No obvious joint restrictions   SKIN: warm and dry without lesions    NEURO:  alert, approp, nl sensorium with  no motor or cerebellar deficits apparent.              Assessment

## 2021-10-26 NOTE — Patient Instructions (Signed)
Plan A = Automatic = Always=    Breztri Take 2 puffs first thing in am and then another 2 puffs about 12 hours later.     Work on inhaler technique:  relax and gently blow all the way out then take a nice smooth full deep breath back in, triggering the inhaler at same time you start breathing in.  Hold for up to 5 seconds if you can. Blow out thru nose. Rinse and gargle with water when done.  If mouth or throat bother you at all,  try brushing teeth/gums/tongue with arm and hammer toothpaste/ make a slurry and gargle and spit out.     Think of Breztri like high octane fuel   Plan B = Backup (to supplement plan A, not to replace it) Only use your albuterol inhaler as a rescue medication to be used if you can't catch your breath by resting or doing a relaxed purse lip breathing pattern.  - The less you use it, the better it will work when you need it. - Ok to use the inhaler up to 2 puffs  every 4 hours if you must but call for appointment if use goes up over your usual need - Don't leave home without it !!  (think of it like starter fluid)   Plan C = Crisis (instead of Plan B but only if Plan B stops working) - only use your albuterol nebulizer if you first try Plan B and it fails to help > ok to use the nebulizer up to every 4 hours but if start needing it regularly call for immediate appointment   Plan D = Doctor - call me if B and C not adequate   Please schedule a follow up visit in 6  months but call sooner if needed

## 2021-10-26 NOTE — Assessment & Plan Note (Addendum)
Active smoker/ pfts at [facility] per pt - 06/02/2021  After extensive coaching inhaler device,  effectiveness =    75% (short Ti) > Breztri trial  - 06/02/2021   Walked on RA x  3  lap(s) =  approx 450 @ slow to mod  pace, stopped due to end of study with sob on last lap with lowest 02 sats 94%  Labs ordered 06/02/2021  :  alpha one AT phenotype  MS  Level 152 / Eos 0.3  - 08/02/2021   Walked on RA x  3  lap(s) =  approx 450 @ modrate pace, stopped due to end of study, sob p 1st lap with lowest 02 sats 95%  - 10/26/2021  After extensive coaching inhaler device,  effectiveness =    80% (short ti)     Group D in terms of symptom/risk and laba/lama/ICS  therefore appropriate rx at this point >>>  Continue breztri and approp saba  Re SABA :  I spent extra time with pt today reviewing appropriate use of albuterol for prn use on exertion with the following points: 1) saba is for relief of sob that does not improve by walking a slower pace or resting but rather if the pt does not improve after trying this first. 2) If the pt is convinced, as many are, that saba helps recover from activity faster then it's easy to tell if this is the case by re-challenging : ie stop, take the inhaler, then p 5 minutes try the exact same activity (intensity of workload) that just caused the symptoms and see if they are substantially diminished or not after saba 3) if there is an activity that reproducibly causes the symptoms, try the saba 15 min before the activity on alternate days   If in fact the saba really does help, then fine to continue to use it prn but advised may need to look closer at the maintenance regimen being used to achieve better control of airways disease with exertion.           Each maintenance medication was reviewed in detail including emphasizing most importantly the difference between maintenance and prns and under what circumstances the prns are to be triggered using an action plan format where  appropriate.  Total time for H and P, chart review, counseling, reviewing hfa/neb device(s) and generating customized AVS unique to this office visit / same day charting = 20 min

## 2022-05-04 ENCOUNTER — Ambulatory Visit (HOSPITAL_COMMUNITY)
Admission: RE | Admit: 2022-05-04 | Discharge: 2022-05-04 | Disposition: A | Discharge status: Home / Self Care | Payer: No Typology Code available for payment source | Source: Ambulatory Visit | Attending: Internal Medicine | Admitting: Internal Medicine

## 2022-05-04 DIAGNOSIS — J449 Chronic obstructive pulmonary disease, unspecified: Secondary | ICD-10-CM | POA: Insufficient documentation

## 2022-05-04 LAB — PULMONARY FUNCTION TEST
DL/VA % pred: 65 %
DL/VA: 2.74 ml/min/mmHg/L
DLCO unc % pred: 48 %
DLCO unc: 12.95 ml/min/mmHg
FEF 25-75 Post: 0.69 L/sec
FEF 25-75 Pre: 0.47 L/sec
FEF2575-%Change-Post: 46 %
FEF2575-%Pred-Post: 24 %
FEF2575-%Pred-Pre: 17 %
FEV1-%Change-Post: 16 %
FEV1-%Pred-Post: 35 %
FEV1-%Pred-Pre: 30 %
FEV1-Post: 1.23 L
FEV1-Pre: 1.05 L
FEV1FVC-%Change-Post: -12 %
FEV1FVC-%Pred-Pre: 67 %
FEV6-%Change-Post: 13 %
FEV6-%Pred-Post: 52 %
FEV6-%Pred-Pre: 45 %
FEV6-Post: 2.29 L
FEV6-Pre: 2.02 L
FEV6FVC-%Change-Post: -15 %
FEV6FVC-%Pred-Post: 86 %
FEV6FVC-%Pred-Pre: 102 %
FVC-%Change-Post: 33 %
FVC-%Pred-Post: 60 %
FVC-%Pred-Pre: 44 %
FVC-Post: 2.79 L
FVC-Pre: 2.08 L
Post FEV1/FVC ratio: 44 %
Post FEV6/FVC ratio: 82 %
Pre FEV1/FVC ratio: 51 %
Pre FEV6/FVC Ratio: 97 %
RV % pred: 255 %
RV: 5.94 L
TLC % pred: 119 %
TLC: 8.38 L

## 2022-05-04 MED ORDER — ALBUTEROL SULFATE (2.5 MG/3ML) 0.083% IN NEBU
2.5000 mg | INHALATION_SOLUTION | Freq: Once | RESPIRATORY_TRACT | Status: AC
  Administered 2022-05-04: 2.5 mg via RESPIRATORY_TRACT

## 2022-05-26 ENCOUNTER — Encounter: Payer: Self-pay | Admitting: Internal Medicine

## 2022-05-26 ENCOUNTER — Ambulatory Visit (INDEPENDENT_AMBULATORY_CARE_PROVIDER_SITE_OTHER): Payer: No Typology Code available for payment source | Admitting: Internal Medicine

## 2022-05-26 DIAGNOSIS — J449 Chronic obstructive pulmonary disease, unspecified: Secondary | ICD-10-CM

## 2022-05-26 DIAGNOSIS — F17210 Nicotine dependence, cigarettes, uncomplicated: Secondary | ICD-10-CM | POA: Diagnosis not present

## 2022-05-26 MED ORDER — BREZTRI AEROSPHERE 160-9-4.8 MCG/ACT IN AERO: 2.0000 | INHALATION_SPRAY | Freq: Two times a day (BID) | RESPIRATORY_TRACT | 11 refills | Status: DC

## 2022-05-26 MED ORDER — BREZTRI AEROSPHERE 160-9-4.8 MCG/ACT IN AERO: 2.0000 | INHALATION_SPRAY | Freq: Two times a day (BID) | RESPIRATORY_TRACT | 0 refills | Status: DC

## 2022-05-26 MED ORDER — PREDNISONE 10 MG PO TABS: ORAL_TABLET | ORAL | 0 refills | Status: DC

## 2022-05-26 MED ORDER — AZITHROMYCIN 250 MG PO TABS: ORAL_TABLET | ORAL | 0 refills | Status: DC

## 2022-05-26 NOTE — Assessment & Plan Note (Signed)
Active smoker/ pfts at [facility] per pt - 06/02/2021  After extensive coaching inhaler device,  effectiveness =    75% (short Ti) > Breztri trial  - 06/02/2021   Walked on RA x  3  lap(s) =  approx 450 @ slow to mod  pace, stopped due to end of study with sob on last lap with lowest 02 sats 94%  Labs ordered 06/02/2021  :  alpha one AT phenotype  MS  Level 152 / Eos 0.3  - 08/02/2021   Walked on RA x  3  lap(s) =  approx 450 @ modrate pace, stopped due to end of study, sob p 1st lap with lowest 02 sats 95%  - PFT's  05/04/22  FEV1 1.23 (35 % ) ratio 0.44  p 16 % improvement from saba p 0 prior to study with DLCO  12.95 (48%)   and FV curve classic concavity  - 05/26/2022  After extensive coaching inhaler device,  effectiveness =    80% > continue breztri   Group D (now reclassified as E) in terms of symptom/risk and laba/lama/ICS  therefore appropriate rx at this point >>>  Continue breztri and more  approp saba:  Re SABA :  I spent extra time with pt today reviewing appropriate use of albuterol for prn use on exertion with the following points: 1) saba is for relief of sob that does not improve by walking a slower pace or resting but rather if the pt does not improve after trying this first. 2) If the pt is convinced, as many are, that saba helps recover from activity faster then it's easy to tell if this is the case by re-challenging : ie stop, take the inhaler, then p 5 minutes try the exact same activity (intensity of workload) that just caused the symptoms and see if they are substantially diminished or not after saba 3) if there is an activity that reproducibly causes the symptoms, try the saba 15 min before the activity on alternate days   If in fact the saba really does help, then fine to continue to use it prn but advised may need to look closer at the maintenance regimen being used to achieve better control of airways disease with exertion.   Try zpak/ pred x 6 days to see if can reset dependency on  so much saba.

## 2022-05-26 NOTE — Assessment & Plan Note (Signed)
Counseled re importance of smoking cessation but did not meet time criteria for separate billing     Lung cancer screening per [facility]          Each maintenance medication was reviewed in detail including emphasizing most importantly the difference between maintenance and prns and under what circumstances the prns are to be triggered using an action plan format where appropriate.  Total time for H and P, chart review, counseling, reviewing hfa device(s) and generating customized AVS unique to this office visit / same day charting > 30 min

## 2022-05-26 NOTE — Patient Instructions (Addendum)
Zpak  Prednisone 10 mg take  4 each am x 2 days,   2 each am x 2 days,  1 each am x 2 days and stop   Plan A = Automatic = Always=    Breztri Take 2 puffs first thing in am and then another 2 puffs about 12 hours later.    Work on inhaler technique:  relax and gently blow all the way out then take a nice smooth full deep breath back in, triggering the inhaler at same time you start breathing in.  Hold breath in for at least  5 seconds if you can. Blow out breztri  thru nose. Rinse and gargle with water when done.  If mouth or throat bother you at all,  try brushing teeth/gums/tongue with arm and hammer toothpaste/ make a slurry and gargle and spit out.    Plan B = Backup (to supplement plan A, not to replace it) Only use your albuterol inhaler as a rescue medication to be used if you can't catch your breath by resting or doing a relaxed purse lip breathing pattern.  - The less you use it, the better it will work when you need it. - Ok to use the inhaler up to 2 puffs  every 4 hours if you must but call for appointment if use goes up over your usual need - Don't leave home without it !!  (think of it like the spare tire for your car)   Plan C = Crisis (instead of Plan B but only if Plan B stops working) - only use your albuterol nebulizer if you first try Plan B and it fails to help > ok to use the nebulizer up to every 4 hours but if start needing it regularly call for immediate appointment   Ok to try albuterol 15 min before an activity (on alternating days)  that you know would usually make you short of breath and see if it makes any difference and if makes none then don't take albuterol after activity unless you can't catch your breath as this means it's the resting that helps, not the albuterol.  The key is to stop smoking completely before smoking completely stops you!Please schedule a follow up visit in 3 months but call sooner if needed  Please schedule a follow up visit in 3 months but  call sooner if needed

## 2022-05-26 NOTE — Progress Notes (Signed)
Raymond Robles, male    DOB: [birth year],    MRN: [id number]   Brief patient profile:  37  yowm  active smoker referred to pulmonary clinic in [place]  06/02/2021 by [facility] for copd   Baseline 2019 was not really limited and gradually worse doe since then    History of Present Illness  06/02/2021  Pulmonary/ 1st office eval/ [name] / [place] Office maint on spiriva Chief Complaint  Patient presents with   Consult    [facility] referral for symptoms beginning last year but worsening in April/May 2022. SOB worsened in may 2022. Coughing up yellow mucus consistently. Notices SOB when laying down at night time accompanied by wheezing and coughing if lying on his back.  Has a nebulizer at home but has not used it. Current smoker.    Dyspnea:  mailbox to house  Cough: worse x 1 year eso first thing x 45 min also hs Sleep: one pillow SABA use: never uses alb noct  Rec Augmentin 875 mg take one pill twice daily  X 10 days   Prednisone 10 mg take  4 each am x 2 days,   2 each am x 2 days,  1 each am x 2 days and stop  Plan A = Automatic = Always=    breztri Take 2 puffs first thing in am and then another 2 puffs about 12 hours later.   Plan B = Backup (to supplement plan A, not to replace it) Only use your albuterol inhaler as a rescue medication Plan C = Crisis (instead of Plan B but only if Plan B stops working) - only use your albuterol nebulizer (8 x stronger than plan A) if you first try Plan B and it fails to help > ok to use the nebulizer up to every 4 hours but if start needing it regularly call for immediate appointment [facility] - if [facility] don't [facility] these medications get them to give you a list of what respiratory medications they do cover The key is to stop smoking completely before smoking completely stops you!    08/02/2021  f/u ov/[place] office/[name] re: copd GOLD ? / still smoking maint on breztri with about 75% effective  technique/ improved since last ov Chief Complaint  Patient  presents with   Follow-up    Cough, wheezing and SOB have improved since last OV.   Dyspnea:  mb and back to house better  Cough: minimal in am mucoid  Sleeping: ok  flat  SABA use: not using now  02: none  Covid status: vax x 2  [facility]21  Rec Plan A = Automatic = Always=    Breztri Take 2 puffs first thing in am and then another 2 puffs about 12 hours later.  OR    [facility] equivalent  = spiriva 2 puffs first thing in am and w[place] one twice daily  Work on inhaler technique:   Plan B = Backup (to supplement plan A, not to replace it) Only use your albuterol inhaler as a rescue medication  Plan C = Crisis (instead of Plan B but only if Plan B stops working) - only use your albuterol nebulizer if you first try Plan B and it fails to help  Please schedule a follow up visit in 3 months but call sooner if needed with pfts on return        10/26/2021  f/u ov/[place] office/[name] re: GOLD ?  maint on breztri 2bid   Chief Complaint  Patient presents with   Follow-up    Breathing wheezing and coughing have improved.   Dyspnea:  mb and back is easier  Cough: some in am / yellowish x 2 coughs and resolves  Sleeping: flat bed one pillow  SABA use: misunderstanding  02: none  Covid status: vax x 3  [facility]ung cancer [facility] Plan A = Automatic = Always=    Breztri Take 2 puffs first thing in am and then another 2 puffs about 12 hours later.  Work on inhaler technique:    Plan B = Backup (to supplement plan A, not to replace it) Only use your albuterol inhaler as a rescue medication  Plan C = Crisis (instead of Plan B but only if Plan B stops working) - only use your albuterol nebulizer if you first try Plan B  Plan D = Doctor - call me if B and C not adequate    05/26/2022  f/u ov/[place] office/[name] re: GOLD 3 copd  maint on Breztri / still smoking  Chief Complaint  Patient presents with   Follow-up    He is doing  well and feels [name] is working well.   Dyspnea:  mb and back ok 200 ft falt round trip, does ok  Cough: just one time in am/ yellowish Sleeping: flat bed one pillow  SABA use: neb tiw/ same for hfa  02: none     No obvious day to day or daytime variability or assoc mucus plugs or hemoptysis or cp or chest tightness, subjective wheeze or overt sinus or hb symptoms.    Also denies any obvious fluctuation of symptoms with weather or environmental changes or other aggravating or alleviating factors except as outlined above   No unusual exposure hx or h/o childhood pna/ asthma or knowledge of premature birth.  Current Allergies, Complete Past Medical History, Past Surgical History, Family History, and Social History were reviewed in [employer] record.  ROS  The following are not active complaints unless bolded Hoarseness, sore throat, dysphagia, dental problems, itching, sneezing,  nasal congestion or discharge of excess mucus or purulent secretions, ear ache,   fever, chills, sweats, unintended wt loss or wt gain, classically pleuritic or exertional cp,  orthopnea pnd or arm/hand swelling  or leg swelling, presyncope, palpitations, abdominal pain, anorexia, nausea, vomiting, diarrhea  or change in bowel habits or change in bladder habits, change in stools or change in urine, dysuria, hematuria,  rash, arthralgias, visual complaints, headache, numbness, weakness or ataxia or problems with walking or coordination,  change in mood or  memory.        Current Meds  Medication Sig   albuterol (PROVENTIL) (2.5 MG/3ML) 0.083% nebulizer solution Take 3 mLs (2.5 mg total) by nebulization every 4 (four) hours as needed for wheezing or shortness of breath.   ALBUTEROL IN Inhale into the lungs.   Budeson-Glycopyrrol-Formoterol (BREZTRI AEROSPHERE) 160-9-4.8 MCG/ACT AERO Take 2 puffs first thing in am and then another 2 puffs about 12 hours later.               Past Medical  History:  Diagnosis Date   COPD (chronic obstructive pulmonary disease) (HCC)        Objective:     Wts  05/26/2022        147  10/26/2021       147   08/02/21 148 lb 0.6 oz (67.2 kg)  06/02/21 145  lb 1.9 oz (65.8 kg)  01/05/17 141 lb (64 kg)     Vital signs reviewed  05/26/2022  - Note at rest 02 sats  92% on RA    General appearance:    amb wm nad    HEENT :  Oropharynx  clear  Nasal turbinates nl    NECK :  without JVD/Nodes/TM/ nl carotid upstrokes bilaterally   LUNGS: no acc muscle use,  Mod barrel  contour chest wall with bilateral  Distant bs s audible wheeze and  without cough on insp or exp maneuvers and mod  Hyperresonant  to  percussion bilaterally     CV:  RRR  no s3 or murmur or increase in P2, and no edema   ABD:  soft and nontender with pos mid insp Hoover's  in the supine position. No bruits or organomegaly appreciated, bowel sounds nl  MS:   Ext warm without deformities or   obvious joint restrictions , calf tenderness, cyanosis or clubbing  SKIN: warm and dry without lesions    NEURO:  alert, approp, nl sensorium with  no motor or cerebellar deficits apparent.              Assessment

## 2022-06-09 IMAGING — DX DG CHEST 2V
2 series · 2 of 2 positions shown · non-contrast
Comparison: Chest x-ray 12/26/2016.

CLINICAL DATA: 65-year-old male with history of productive cough.

EXAM:
CHEST - 2 VIEW

[chest pa]
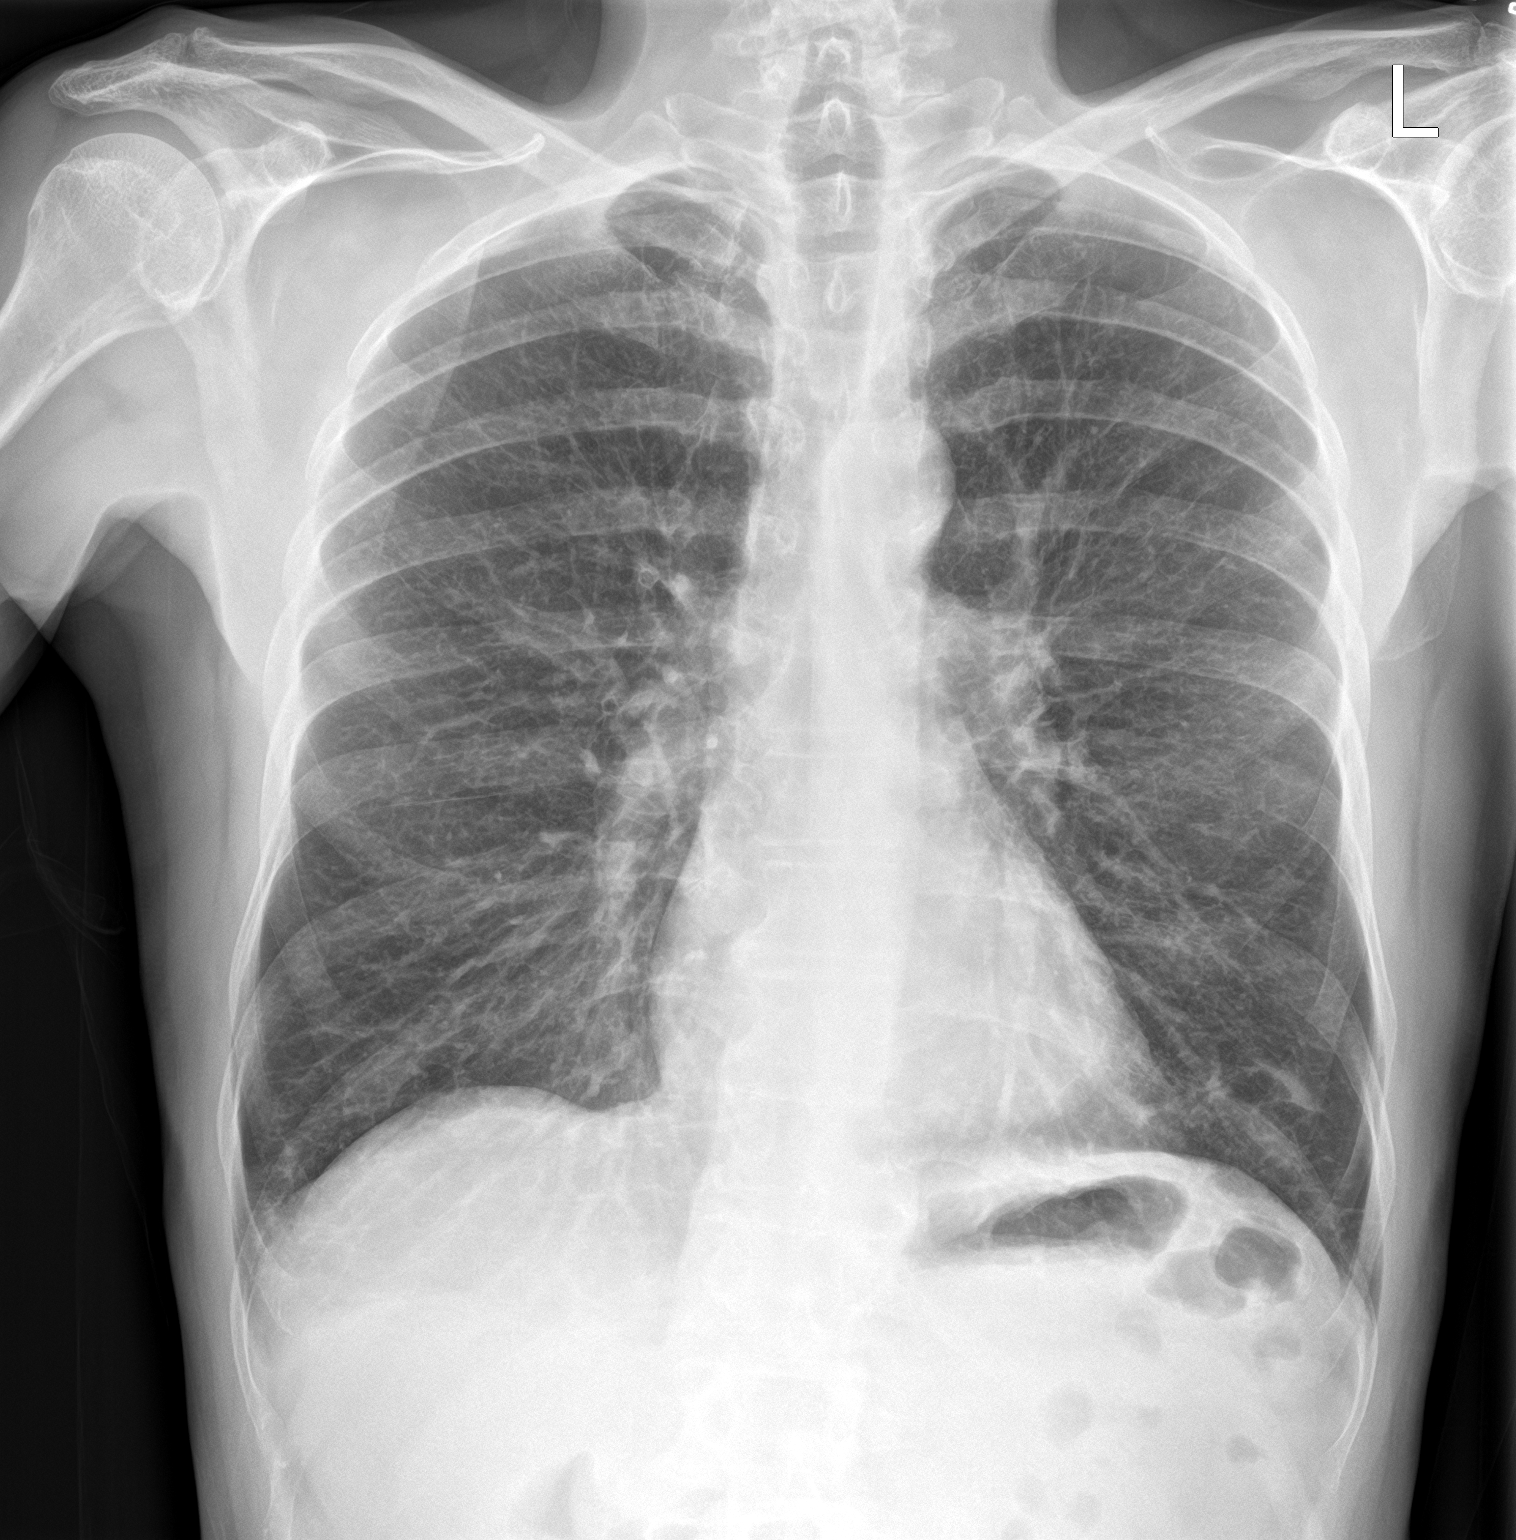

[chest lat]
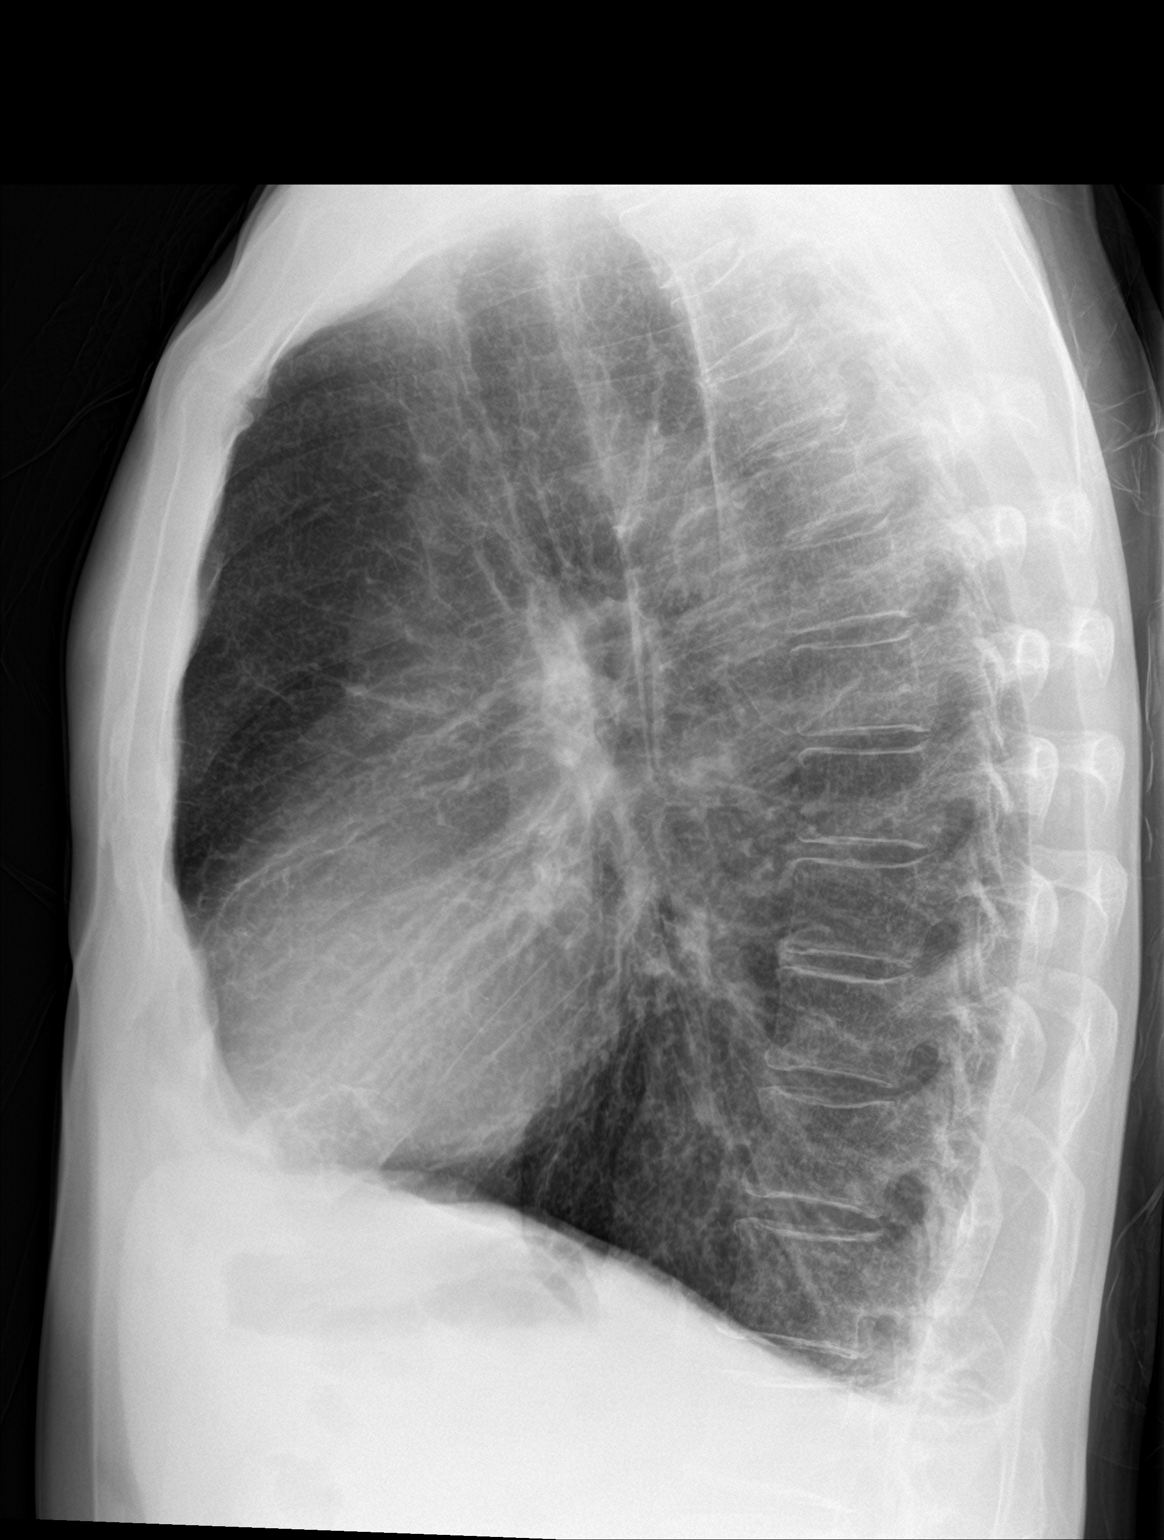

[2 of 2 positions shown; findings below may reference images not displayed]

FINDINGS: Lung volumes are increased with emphysematous changes and diffuse
peribronchial cuffing. No consolidative airspace disease. No pleural
effusions. No pneumothorax. No pulmonary nodule or mass noted.
Pulmonary vasculature and the cardiomediastinal silhouette are
within normal limits. Atherosclerotic calcifications in the thoracic
aorta.
IMPRESSION: 1. Emphysema with extensive peribronchial cuffing. This could
reflect an acute or chronic bronchitis.
2. Aortic atherosclerosis.

## 2022-08-16 ENCOUNTER — Ambulatory Visit (INDEPENDENT_AMBULATORY_CARE_PROVIDER_SITE_OTHER): Payer: No Typology Code available for payment source | Admitting: Internal Medicine

## 2022-08-16 ENCOUNTER — Encounter: Payer: Self-pay | Admitting: Internal Medicine

## 2022-08-16 VITALS — BP 138/76 | HR 100 | Temp 98.4°F | Ht 71.0 in | Wt 149.4 lb

## 2022-08-16 DIAGNOSIS — F17210 Nicotine dependence, cigarettes, uncomplicated: Secondary | ICD-10-CM

## 2022-08-16 DIAGNOSIS — J449 Chronic obstructive pulmonary disease, unspecified: Secondary | ICD-10-CM

## 2022-08-16 MED ORDER — PREDNISONE 10 MG PO TABS: ORAL_TABLET | ORAL | 11 refills | Status: DC

## 2022-08-16 NOTE — Progress Notes (Signed)
Raymond Robles, male    DOB: Oct 28, 1955,    MRN: [id number]   Brief patient profile:  49  yowm  active smoker referred to pulmonary clinic in [place]  06/02/2021 by [facility] for copd   Baseline 2019 was not really limited and gradually worse doe since then    History of Present Illness  06/02/2021  Pulmonary/ 1st office eval/ [name] / [place] [facility] maint on spiriva Chief Complaint  Patient presents with   Consult    VA referral for symptoms beginning last year but worsening in April/May 2022. SOB worsened in may 2022. Coughing up yellow mucus consistently. Notices SOB when laying down at night time accompanied by wheezing and coughing if lying on his back.  Has a nebulizer at home but has not used it. Current smoker.    Dyspnea:  mailbox to house  Cough: worse x 1 year eso first thing x 45 min also hs Sleep: one pillow SABA use: never uses alb noct  Rec Augmentin 875 mg take one pill twice daily  X 10 days   Prednisone 10 mg take  4 each am x 2 days,   2 each am x 2 days,  1 each am x 2 days and stop  Plan A = Automatic = Always=    breztri Take 2 puffs first thing in am and then another 2 puffs about 12 hours later.   Plan B = Backup (to supplement plan A, not to replace it) Only use your albuterol inhaler as a rescue medication Plan C = Crisis (instead of Plan B but only if Plan B stops working) - only use your albuterol nebulizer (8 x stronger than plan A) if you first try Plan B and it fails to help > ok to use the nebulizer up to every 4 hours but if start needing it regularly call for immediate appointment [facility] with [facility] - if they don't cover these medications get them to give you a list of what respiratory medications they do cover The key is to stop smoking completely before smoking completely stops you!     10/26/2021  f/u ov/[place] office/Meeyah Ovitt re: GOLD ?  maint on breztri 2bid   Chief Complaint  Patient presents with   Follow-up    Breathing wheezing and coughing have improved.    Dyspnea:  mb and back is easier  Cough: some in am / yellowish x 2 coughs and resolves  Sleeping: flat bed one pillow  SABA use: misunderstanding  02: none  Covid status: vax x 3  [facility]ung cancer [facility] Plan A = Automatic = Always=    Breztri Take 2 puffs first thing in am and then another 2 puffs about 12 hours later.  Work on inhaler technique:    Plan B = Backup (to supplement plan A, not to replace it) Only use your albuterol inhaler as a rescue medication  Plan C = Crisis (instead of Plan B but only if Plan B stops working) - only use your albuterol nebulizer if you first try Plan B  Plan D = Doctor - call me if B and C not adequate    05/26/2022  f/u ov/ office/Shady Padron re: GOLD 3 copd  maint on Breztri / still smoking  Chief Complaint  Patient presents with   Follow-up    He is doing well and feels [name] is working well.   Dyspnea:  mb and back ok 200 ft falt round trip, does ok  Cough: just one  time in am/ yellowish Sleeping: flat bed one pillow  SABA use: neb tiw/ same for hfa  02: none  Rec Zpak Prednisone 10 mg take  4 each am x 2 days,   2 each am x 2 days,  1 each am x 2 days and stop  Plan A = Automatic = Always=    Breztri Take 2 puffs first thing in am and then another 2 puffs about 12 hours later.  Work on inhaler technique: Plan B = Backup (to supplement plan A, not to replace it) Only use your albuterol inhaler as a rescue medication  Plan C = Crisis (instead of Plan B but only if Plan B stops working) - only use your albuterol nebulizer if you first try Plan B  Ok to try albuterol 15 min before an activity (on alternating days)  that you know would usually make you short of breath  The key is to stop smoking completely before smoking completely stops you! Please schedule a follow up visit in 3 months but call sooner if needed    08/16/2022  f/u ov/[place] office/Sahithi Ordoyne re: GOLD 3  maint on Breztri  / wife advising on how / when to  use saba (not following action plan above)  Chief Complaint  Patient presents with   Follow-up    Patient has had congestion in head and moved down to chest. Started early November. Coughing up clear sputum but was yellow, green and brown.   Dyspnea:  mb and back better than it was but still very sob  Cough: clear now  Sleeping: flat bed one pillow  SABA use: hfa just a few times since last  02: none  Covid status: none  [facility]    No obvious day to day or daytime variability or assoc excess/ purulent sputum or mucus plugs or hemoptysis or cp or chest tightness, subjective wheeze or overt sinus or hb symptoms.     Also denies any obvious fluctuation of symptoms with weather or environmental changes or other aggravating or alleviating factors except as outlined above   No unusual exposure hx or h/o childhood pna/ asthma or knowledge of premature birth.  Current Allergies, Complete Past Medical History, Past Surgical History, Family History, and Social History were reviewed in [employer] record.  ROS  The following are not active complaints unless bolded Hoarseness, sore throat, dysphagia, dental problems, itching, sneezing,  nasal congestion or discharge of excess mucus or purulent secretions, ear ache,   fever, chills, sweats, unintended wt loss or wt gain, classically pleuritic or exertional cp,  orthopnea pnd or arm/hand swelling  or leg swelling, presyncope, palpitations, abdominal pain, anorexia, nausea, vomiting, diarrhea  or change in bowel habits or change in bladder habits, change in stools or change in urine, dysuria, hematuria,  rash, arthralgias, visual complaints, headache, numbness, weakness or ataxia or problems with walking or coordination,  change in mood or  memory.        Current Meds  Medication Sig   albuterol (PROVENTIL) (2.5 MG/3ML) 0.083% nebulizer solution Take 3 mLs (2.5 mg total) by  nebulization every 4 (four) hours as needed for wheezing or shortness of breath.   ALBUTEROL IN Inhale into the lungs.   Budeson-Glycopyrrol-Formoterol (BREZTRI AEROSPHERE) 160-9-4.8 MCG/ACT AERO Inhale 2 puffs into the lungs 2 (two) times daily.   guaifenesin (HUMIBID E) 400 MG TABS tablet Take 400 mg by mouth every 4 (four) hours.  Past Medical History:  Diagnosis Date   COPD (chronic obstructive pulmonary disease) (HCC)        Objective:     Wts  08/16/2022        149  05/26/2022        147  10/26/2021       147   08/02/21 148 lb 0.6 oz (67.2 kg)  06/02/21 145 lb 1.9 oz (65.8 kg)  01/05/17 141 lb (64 kg)    Vital signs reviewed  08/16/2022  - Note at rest 02 sats  94% on RA   General appearance:    amb wm > stated age chronically ill appearing   HEENT :  Oropharynx  clear      NECK :  without JVD/Nodes/TM/ nl carotid upstrokes bilaterally   LUNGS: no acc muscle use,  Mod barrel  contour chest wall with bilateral  Distant pan exp  wheeze and  without cough on insp or exp maneuvers and mod  Hyperresonant  to  percussion bilaterally     CV:  RRR  no s3 or murmur or increase in P2, and no edema   ABD:  soft and nontender with pos mid insp Hoover's  in the supine position. No bruits or organomegaly appreciated, bowel sounds nl  MS:   Ext warm without deformities or   obvious joint restrictions , calf tenderness, cyanosis or clubbing  SKIN: warm and dry without lesions    NEURO:  alert, approp, nl sensorium with  no motor or cerebellar deficits apparent.                  Assessment

## 2022-08-16 NOTE — Assessment & Plan Note (Signed)
4-5 min discussion re active cigarette smoking in addition to office E&M  Ask about tobacco use:   ongoing Advise quitting   I took an extended  opportunity with this patient to outline the consequences of continued cigarette use  in airway disorders based on all the data we have from the multiple national lung health studies (perfomed over decades at millions of dollars in cost)  indicating that smoking cessation, not choice of inhalers or physicians, is the most important aspect of his care.   Assess willingness:  Not committed at this point Assist in quit attempt:  Per PCP when ready Arrange follow up:   Follow up per Primary Care planned        

## 2022-08-16 NOTE — Assessment & Plan Note (Addendum)
Active smoker/ pfts at [facility] per pt - 06/02/2021  After extensive coaching inhaler device,  effectiveness =    75% (short Ti) > Breztri trial  - 06/02/2021   Walked on RA x  3  lap(s) =  approx 450 @ slow to mod  pace, stopped due to end of study with sob on last lap with lowest 02 sats 94%  Labs ordered 06/02/2021  :  alpha one AT phenotype  MS  Level 152 / Eos 0.3  - 08/02/2021   Walked on RA x  3  lap(s) =  approx 450 @ modrate pace, stopped due to end of study, sob p 1st lap with lowest 02 sats 95%  - PFT's  05/04/22  FEV1 1.23 (35 % ) ratio 0.44  p 16 % improvement from saba p 0 prior to study with DLCO  12.95 (48%)   and FV curve classic concavity  - 05/26/2022  After extensive coaching inhaler device,  effectiveness =    80% > continue breztri - 08/16/2022 added pred x 6d as plan D - 08/16/2022   Walked on RA  x  3  lap(s) =  approx 450  ft  @ mod pace, stopped due to end of study  with lowest 02 sats 92% and sob during last lap   [facility] records not available on EPIC but clinially  Group D (now reclassified as E) in terms of symptom/risk and laba/lama/ICS  therefore appropriate rx at this point >>>  continue breztri, approp saba and plan D = deltasone see avs for instructions unique to this ov   Each maintenance medication was reviewed in detail including emphasizing most importantly the difference between maintenance and prns and under what circumstances the prns are to be triggered using an action plan format where appropriate.  Total time for H and P, chart review, counseling, reviewing hfa/neb device(s) , directly observing portions of ambulatory 02 saturation study/ and generating customized AVS unique to this office visit / same day charting > 30 min

## 2022-08-16 NOTE — Patient Instructions (Addendum)
Plan A = Automatic = Always=    Breztri Take 2 puffs first thing in am and then another 2 puffs about 12 hours later.    Work on inhaler technique:  relax and gently blow all the way out then take a nice smooth full deep breath back in, triggering the inhaler at same time you start breathing in.  Hold breath in for at least  5 seconds if you can. Blow out breztri  thru nose. Rinse and gargle with water when done.  If mouth or throat bother you at all,  try brushing teeth/gums/tongue with arm and hammer toothpaste/ make a slurry and gargle and spit out.    Plan B = Backup (to supplement plan A, not to replace it) Only use your albuterol inhaler as a rescue medication to be used if you can't catch your breath by resting or doing a relaxed purse lip breathing pattern.  - The less you use it, the better it will work when you need it. - Ok to use the inhaler up to 2 puffs  every 4 hours if you must but call for appointment if use goes up over your usual need - Don't leave home without it !!  (think of it like the spare tire for your car)   Plan C = Crisis (instead of Plan B but only if Plan B stops working) - only use your albuterol nebulizer if you first try Plan B and it fails to help > ok to use the nebulizer up to every 4 hours but if start needing it regularly call for immediate appointment  Plan D= Deltasone = prednisone which is  refillable  Prednisone 10 mg take  4 each am x 2 days,   2 each am x 2 days,  1 each am x 2 days and stop   Ok to try albuterol 15 min before an activity (on alternating days)  that you know would usually make you short of breath and see if it makes any difference and if makes none then don't take albuterol after activity unless you can't catch your breath as this means it's the resting that helps, not the albuterol.  The key is to stop smoking completely before smoking completely stops you!Please schedule a follow up visit in 3 months but call sooner if  needed  Please schedule a follow up office visit in 6 weeks , call sooner if needed with all medications /inhalers/ solutions in hand so we can verify exactly what you are taking. This includes all medications from all doctors and over the counters

## 2022-09-27 ENCOUNTER — Ambulatory Visit (HOSPITAL_COMMUNITY)
Admission: RE | Admit: 2022-09-27 | Discharge: 2022-09-27 | Disposition: A | Discharge status: Home / Self Care | Payer: No Typology Code available for payment source | Source: Ambulatory Visit | Attending: Internal Medicine | Admitting: Internal Medicine

## 2022-09-27 ENCOUNTER — Ambulatory Visit (INDEPENDENT_AMBULATORY_CARE_PROVIDER_SITE_OTHER): Payer: No Typology Code available for payment source | Admitting: Internal Medicine

## 2022-09-27 ENCOUNTER — Encounter: Payer: Self-pay | Admitting: Internal Medicine

## 2022-09-27 VITALS — BP 136/88 | HR 88 | Temp 98.2°F | Ht 71.0 in | Wt 154.0 lb

## 2022-09-27 DIAGNOSIS — J449 Chronic obstructive pulmonary disease, unspecified: Secondary | ICD-10-CM | POA: Insufficient documentation

## 2022-09-27 MED ORDER — AZITHROMYCIN 250 MG PO TABS: ORAL_TABLET | ORAL | 0 refills | Status: AC

## 2022-09-27 MED ORDER — SPIRIVA RESPIMAT 2.5 MCG/ACT IN AERS: INHALATION_SPRAY | RESPIRATORY_TRACT | 11 refills | Status: AC

## 2022-09-27 MED ORDER — BUDESONIDE-FORMOTEROL FUMARATE 160-4.5 MCG/ACT IN AERO: INHALATION_SPRAY | RESPIRATORY_TRACT | 12 refills | Status: AC

## 2022-09-27 MED ORDER — PREDNISONE 10 MG PO TABS: ORAL_TABLET | ORAL | 11 refills | Status: AC

## 2022-09-27 NOTE — Assessment & Plan Note (Signed)
Active smoker/ pfts at [facility] per pt - 06/02/2021  After extensive coaching inhaler device,  effectiveness =    75% (short Ti) > Breztri trial  - 06/02/2021   Walked on RA x  3  lap(s) =  approx 450 @ slow to mod  pace, stopped due to end of study with sob on last lap with lowest 02 sats 94%  Labs ordered 06/02/2021  :  alpha one AT phenotype  MS  Level 152 / Eos 0.3  - 08/02/2021   Walked on RA x  3  lap(s) =  approx 450 @ modrate pace, stopped due to end of study, sob p 1st lap with lowest 02 sats 95%  - PFT's  05/04/22  FEV1 1.23 (35 % ) ratio 0.44  p 16 % improvement from saba p 0 prior to study with DLCO  12.95 (48%)   and FV curve classic concavity  - 05/26/2022  After extensive coaching inhaler device,  effectiveness =    80% > continue breztri - 08/16/2022 added pred x 6d as plan D - 08/16/2022   Walked on RA  x  3  lap(s) =  approx 450  ft  @ mod pace, stopped due to end of study  with lowest 02 sats 92% and sob during last lap - 09/27/2022  After extensive coaching inhaler device,  effectiveness =    80% with smi > changed to spiriva 2.5 smi and symbicort 160 since can't get breztri thru [place]  Mild flare in setting of URI  c/w  Group D (now reclassified as E) in terms of symptom/risk and laba/lama/ICS  therefore appropriate rx at this point >>>  symb 160/ spiriva 2.5 and approp saba plus pred as pland d as above   F/u can be thru [place] or here whichever he/VA prefer.          Each maintenance medication was reviewed in detail including emphasizing most importantly the difference between maintenance and prns and under what circumstances the prns are to be triggered using an action plan format where appropriate.  Total time for H and P, chart review, counseling, reviewing hfa/smi/neb device(s) and generating customized AVS unique to this office visit / same day charting  > 30 min final f/u ov

## 2022-09-27 NOTE — Patient Instructions (Addendum)
Plan A = Automatic = Always=   Symbicort 160  Take 2 puffs first thing in am and then another 2 puffs about 12 hours later.   Spirva 2.5  take 2 puffs each am    Work on inhaler technique:  relax and gently blow all the way out then take a nice smooth full deep breath back in, triggering the inhaler at same time you start breathing in.  Hold breath in for at least  5 seconds if you can. Blow out symbicort  thru nose. Rinse and gargle with water when done.  If mouth or throat bother you at all,  try brushing teeth/gums/tongue with arm and hammer toothpaste/ make a slurry and gargle and spit out.    Plan B = Backup (to supplement plan A, not to replace it) Only use your albuterol inhaler as a rescue medication to be used if you can't catch your breath by resting or doing a relaxed purse lip breathing pattern.  - The less you use it, the better it will work when you need it. - Ok to use the inhaler up to 2 puffs  every 4 hours if you must but call for appointment if use goes up over your usual need - Don't leave home without it !!  (think of it like the spare tire for your car)   Plan C = Crisis (instead of Plan B but only if Plan B stops working) - only use your albuterol nebulizer if you first try Plan B and it fails to help > ok to use the nebulizer up to every 4 hours but if start needing it regularly call for immediate appointment  Plan D= Deltasone = prednisone which is  refillable  Prednisone 10 mg take  4 each am x 2 days,   2 each am x 2 days,  1 each am x 2 days and stop   Ok to try albuterol 15 min before an activity (on alternating days)  that you know would usually make you short of breath and see if it makes any difference and if makes none then don't take albuterol after activity unless you can't catch your breath as this means it's the resting that helps, not the albuterol.  Zpak   Please remember to go to the  x-ray department  @  [facility] for your tests - we will call  you with the results when they are available     Follow up is as needed per [facility]

## 2022-09-27 NOTE — Progress Notes (Signed)
Raymond Robles, male    DOB: [birth year],    MRN: [id number]   Brief patient profile:  69  yowm  stopped smoke 09/11/2022  referred to pulmonary clinic in [place]  06/02/2021 by [facility] for copd   Baseline 2019 was not really limited and gradually worse doe since then    History of Present Illness  06/02/2021  Pulmonary/ 1st office eval/ Melvyn Novas / [place] [facility] maint on spiriva Chief Complaint  Patient presents with   Consult    [facility] beginning last year but worsening in April/May 2022. SOB worsened in may 2022. Coughing up yellow mucus consistently. Notices SOB when laying down at night time accompanied by wheezing and coughing if lying on his back.  Has a nebulizer at home but has not used it. Current smoker.    Dyspnea:  mailbox to house  Cough: worse x 1 year eso first thing x 45 min also hs Sleep: one pillow SABA use: never uses alb noct  Rec Augmentin 875 mg take one pill twice daily  X 10 days   Prednisone 10 mg take  4 each am x 2 days,   2 each am x 2 days,  1 each am x 2 days and stop  Plan A = Automatic = Always=    breztri Take 2 puffs first thing in am and then another 2 puffs about 12 hours later.   Plan B = Backup (to supplement plan A, not to replace it) Only use your albuterol inhaler as a rescue medication Plan C = Crisis (instead of Plan B but only if Plan B stops working) - only use your albuterol nebulizer (8 x stronger than plan A) if you first try Plan B and it fails to help > ok to use the nebulizer up to every 4 hours but if start needing it regularly call for immediate appointment [facility] - if they don't [facility] these medications get them to give you a list of what respiratory medications they do cover The key is to stop smoking completely before smoking completely stops you!     10/26/2021  f/u ov/[place] office/Kylina Vultaggio re: GOLD ?  maint on breztri 2bid   Chief Complaint  Patient presents with   Follow-up    Breathing wheezing and coughing have  improved.   Dyspnea:  mb and back is easier  Cough: some in am / yellowish x 2 coughs and resolves  Sleeping: flat bed one pillow  SABA use: misunderstanding  02: none  Covid status: vax x 3  [facility] Plan A = Automatic = Always=    Breztri Take 2 puffs first thing in am and then another 2 puffs about 12 hours later.  Work on inhaler technique:    Plan B = Backup (to supplement plan A, not to replace it) Only use your albuterol inhaler as a rescue medication  Plan C = Crisis (instead of Plan B but only if Plan B stops working) - only use your albuterol nebulizer if you first try Plan B  Plan D = Doctor - call me if B and C not adequate    05/26/2022  f/u ov/[place] office/Massiah Minjares re: GOLD 3 copd  maint on Breztri / still smoking  Chief Complaint  Patient presents with   Follow-up    He is doing well and feels Judithann Sauger is working well.   Dyspnea:  mb and back ok 200 ft falt round trip, does ok  Cough:  just one time in am/ yellowish Sleeping: flat bed one pillow  SABA use: neb tiw/ same for hfa  02: none  Rec Zpak Prednisone 10 mg take  4 each am x 2 days,   2 each am x 2 days,  1 each am x 2 days and stop  Plan A = Automatic = Always=    Breztri Take 2 puffs first thing in am and then another 2 puffs about 12 hours later.  Work on inhaler technique: Plan B = Backup (to supplement plan A, not to replace it) Only use your albuterol inhaler as a rescue medication  Plan C = Crisis (instead of Plan B but only if Plan B stops working) - only use your albuterol nebulizer if you first try [place] to try albuterol 15 min before an activity (on alternating days)  that you know would usually make you short of breath  The key is to stop smoking completely before smoking completely stops you! Please schedule a follow up visit in 3 months but call sooner if needed    08/16/2022  f/u ov/[place] office/Merlean Pizzini re: GOLD 3  maint on Breztri  / wife advising on how /  when to use saba (not following action plan above)  Chief Complaint  Patient presents with   Follow-up    Patient has had congestion in head and moved down to chest. Started early November. Coughing up clear sputum but was yellow, green and brown.   Dyspnea:  mb and back better than it was but still very sob  Cough: clear now  Sleeping: flat bed one pillow  SABA use: hfa just a few times since last  02: none  Covid status: none  [facility] [place] [facility]  Rec Plan A = Automatic = Always=    Breztri Take 2 puffs first thing in am and then another 2 puffs about 12 hours later.   Work on inhaler technique:  Plan B = Backup (to supplement plan A, not to replace it) Only use your albuterol inhaler as a rescue medication Plan C = Crisis (instead of Plan B but only if Plan B stops working) - only use your albuterol nebulizer if you first try Plan B and it fails to help > ok to use the nebulizer up to every 4 hours but if start needing it regularly call for immediate appointment Plan D= Deltasone = prednisone which is  refillable  Prednisone 10 mg take  4 each am x 2 days,   2 each am x 2 days,  1 each am x 2 days and stop  Ok to try albuterol 15 min before an activity (on alternating days)  that you know would usually make you short of breath Please schedule a follow up office visit in 6 weeks , call sooner if needed with all medications /inhalers/ solutions in hand    09/27/2022  f/u ov/[place] office/Linford Quintela re: GOLD 3  maint on saba and [place]  Chief Complaint  Patient presents with   Follow-up    Breathing better since last ov but states he was dx with pneumonia a few weeks ago   Dyspnea:  mb and back 50 ft  Cough: yellow  Sleeping: flat bed one pillow  SABA use: once a day plus spiriva dpi/ rarely nebulizer 02: none  Covid status: 3 vax  Lung cancer screening: Oct yearly    No obvious day to day or daytime  variability or assoc   or mucus plugs  or hemoptysis or cp or chest tightness, subjective wheeze or overt sinus or hb symptoms.   Sleeping as above without nocturnal  or early am exacerbation  of respiratory  c/o's or need for noct saba. Also denies any obvious fluctuation of symptoms with weather or environmental changes or other aggravating or alleviating factors except as outlined above   No unusual exposure hx or h/o childhood pna/ asthma or knowledge of premature birth.  Current Allergies, Complete Past Medical History, Past Surgical History, Family History, and Social History were reviewed in [employer] record.  ROS  The following are not active complaints unless bolded Hoarseness, sore throat, dysphagia, dental problems, itching, sneezing,  nasal congestion or discharge of excess mucus or purulent secretions, ear ache,   fever, chills, sweats, unintended wt loss or wt gain, classically pleuritic or exertional cp,  orthopnea pnd or arm/hand swelling  or leg swelling, presyncope, palpitations, abdominal pain, anorexia, nausea, vomiting, diarrhea  or change in bowel habits or change in bladder habits, change in stools or change in urine, dysuria, hematuria,  rash, arthralgias, visual complaints, headache, numbness, weakness or ataxia or problems with walking or coordination,  change in mood/ anxious since quit smoking  or  memory.        Current Meds  Medication Sig   albuterol (PROVENTIL) (2.5 MG/3ML) 0.083% nebulizer solution Take 3 mLs (2.5 mg total) by nebulization every 4 (four) hours as needed for wheezing or shortness of breath.   ALBUTEROL IN Inhale into the lungs.   Budeson-Glycopyrrol-Formoterol (BREZTRI AEROSPHERE) 160-9-4.8 MCG/ACT AERO Inhale 2 puffs into the lungs 2 (two) times daily.   guaifenesin (HUMIBID E) 400 MG TABS tablet Take 400 mg by mouth every 4 (four) hours.              Past Medical History:  Diagnosis Date   COPD (chronic obstructive pulmonary disease) (HCC)         Objective:    Wts  09/27/2022        154  08/16/2022        149  05/26/2022        147  10/26/2021       147   08/02/21 148 lb 0.6 oz (67.2 kg)  06/02/21 145 lb 1.9 oz (65.8 kg)  01/05/17 141 lb (64 kg)    Vital signs reviewed  09/27/2022  - Note at rest 02 sats  94% on RA   General appearance:    amb anxious wm nad / congested sounding cough       HEENT :  Oropharynx  clear     NECK :  without JVD/Nodes/TM/ nl carotid upstrokes bilaterally   LUNGS: no acc muscle use,  Mod barrel  contour chest wall with bilateral  insp/exp rhonchi and  without cough on insp or exp maneuvers and mod  Hyperresonant  to  percussion bilaterally     CV:  RRR  no s3 or murmur or increase in P2, and no edema   ABD:  soft and nontender with pos mid insp Hoover's  in the supine position. No bruits or organomegaly appreciated, bowel sounds nl  MS:   Ext warm without deformities or   obvious joint restrictions , calf tenderness, cyanosis or clubbing  SKIN: warm and dry without lesions    NEURO:  alert, approp, nl sensorium with  no motor or cerebellar deficits apparent.  Assessment

## 2024-02-11 ENCOUNTER — Encounter: Payer: Self-pay | Admitting: *Deleted

## 2024-02-15 ENCOUNTER — Encounter: Payer: Self-pay | Admitting: *Deleted

## 2024-02-18 ENCOUNTER — Telehealth: Payer: Self-pay | Admitting: *Deleted

## 2024-02-18 NOTE — Telephone Encounter (Signed)
 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 [facility] [place] [address] [place], [place] [address] Phone: [phone] Fax: [phone]  Advised to refer back to [facility] for continuity of care. Received call from [facility] with [facility] [place]. Reports that patient does not wish to return to [place].   Discussed with Dr. Cherilyn Corn. Recommended referral to [facility] Col[facility] as case appears to require more care than can be offered in [place].

## 2024-02-19 ENCOUNTER — Ambulatory Visit: Admitting: Surgery
